# Patient Record
Sex: Female | Born: 1955 | Race: Black or African American | Hispanic: No | Marital: Single | Smoking: Former smoker
Health system: Southern US, Community
[De-identification: ages and names within clinical notes are randomized; demographics above are authoritative.]

## PROBLEM LIST (undated history)

## (undated) DIAGNOSIS — N289 Disorder of kidney and ureter, unspecified: Secondary | ICD-10-CM

## (undated) DIAGNOSIS — K859 Acute pancreatitis without necrosis or infection, unspecified: Secondary | ICD-10-CM

## (undated) DIAGNOSIS — I10 Essential (primary) hypertension: Secondary | ICD-10-CM

## (undated) DIAGNOSIS — L0390 Cellulitis, unspecified: Secondary | ICD-10-CM

## (undated) DIAGNOSIS — K219 Gastro-esophageal reflux disease without esophagitis: Secondary | ICD-10-CM

---

## 2008-06-15 ENCOUNTER — Emergency Department (HOSPITAL_BASED_OUTPATIENT_CLINIC_OR_DEPARTMENT_OTHER): Admission: EM | Admit: 2008-06-15 | Discharge: 2008-06-15 | Payer: Self-pay | Admitting: Emergency Medicine

## 2009-07-07 ENCOUNTER — Emergency Department (HOSPITAL_BASED_OUTPATIENT_CLINIC_OR_DEPARTMENT_OTHER): Admission: EM | Admit: 2009-07-07 | Discharge: 2009-07-07 | Payer: Self-pay | Admitting: Emergency Medicine

## 2009-07-07 ENCOUNTER — Ambulatory Visit: Payer: Self-pay | Admitting: Diagnostic Radiology

## 2013-05-20 DIAGNOSIS — L0390 Cellulitis, unspecified: Secondary | ICD-10-CM

## 2013-05-20 HISTORY — DX: Cellulitis, unspecified: L03.90

## 2013-05-30 ENCOUNTER — Encounter (HOSPITAL_BASED_OUTPATIENT_CLINIC_OR_DEPARTMENT_OTHER): Payer: Self-pay | Admitting: Emergency Medicine

## 2013-05-30 ENCOUNTER — Emergency Department (HOSPITAL_BASED_OUTPATIENT_CLINIC_OR_DEPARTMENT_OTHER)
Admission: EM | Admit: 2013-05-30 | Discharge: 2013-05-30 | Disposition: A | Discharge status: Home / Self Care | Payer: No Typology Code available for payment source | Attending: Emergency Medicine | Admitting: Emergency Medicine

## 2013-05-30 DIAGNOSIS — R51 Headache: Secondary | ICD-10-CM | POA: Insufficient documentation

## 2013-05-30 DIAGNOSIS — Z79899 Other long term (current) drug therapy: Secondary | ICD-10-CM | POA: Insufficient documentation

## 2013-05-30 DIAGNOSIS — L02619 Cutaneous abscess of unspecified foot: Secondary | ICD-10-CM | POA: Insufficient documentation

## 2013-05-30 DIAGNOSIS — L0390 Cellulitis, unspecified: Secondary | ICD-10-CM

## 2013-05-30 DIAGNOSIS — Z880 Allergy status to penicillin: Secondary | ICD-10-CM | POA: Insufficient documentation

## 2013-05-30 DIAGNOSIS — F17200 Nicotine dependence, unspecified, uncomplicated: Secondary | ICD-10-CM | POA: Insufficient documentation

## 2013-05-30 MED ORDER — LEVOFLOXACIN 500 MG PO TABS: 500.0000 mg | ORAL_TABLET | Freq: Every day | ORAL | Status: DC

## 2013-05-30 MED ORDER — LORATADINE 10 MG PO TABS: 10.0000 mg | ORAL_TABLET | Freq: Every day | ORAL | Status: DC

## 2013-05-30 MED ORDER — HYDROCODONE-ACETAMINOPHEN 5-325 MG PO TABS: 1.0000 | ORAL_TABLET | ORAL | Status: DC | PRN

## 2013-05-30 MED ORDER — LEVOFLOXACIN IN D5W 500 MG/100ML IV SOLN
500.0000 mg | Freq: Once | INTRAVENOUS | Status: AC
  Administered 2013-05-30: 500 mg via INTRAVENOUS
  Filled 2013-05-30: qty 100

## 2013-05-30 NOTE — ED Provider Notes (Signed)
CSN: [id number]     Arrival date & time 05/30/13  1632 History   First MD Initiated Contact with Patient 05/30/13 1702     Chief Complaint  Patient presents with  . Insect Bite    HPI Patient states she thinks she was stung by any insect or bitten by some insect about 4 days ago. She was putting her foot into his shoe when she felt a sharp pain in the top of her foot. She struck her foot out but did not see any particular bug or insect. She subsequently developed an area of redness and irritation that was initially itchy. She subsequently developed a rash on her torso as well as her face. This rash was itchy as well and she noticed some raised small bumps.  Over the last day or 2 she started developing pain in her right foot as well as redness and swelling that extended up to the top of her life. She has not noticed any fevers. She has not had any vomiting or diarrhea. She has no swelling or irritation any of her joints. She does have a slight headache. History reviewed. No pertinent past medical history. History reviewed. No pertinent past surgical history. No family history on file. History  Substance Use Topics  . Smoking status: Current Every Day Smoker -- 0.50 packs/day    Types: Cigarettes  . Smokeless tobacco: Not on file  . Alcohol Use: No   OB History   Grav Para Term Preterm Abortions TAB SAB Ect Mult Living                 Review of Systems  All other systems reviewed and are negative.    Allergies  Penicillins  Home Medications   Current Outpatient Rx  Name  Route  Sig  Dispense  Refill  . HYDROcodone-acetaminophen (NORCO/VICODIN) 5-325 MG per tablet   Oral   Take 1-2 tablets by mouth every 4 (four) hours as needed.   16 tablet   0   . levofloxacin (LEVAQUIN) 500 MG tablet   Oral   Take 1 tablet (500 mg total) by mouth daily.   10 tablet   0   . loratadine (CLARITIN) 10 MG tablet   Oral   Take 1 tablet (10 mg total) by mouth daily.   30 tablet   0     BP 106/87  Pulse 79  Temp(Src) 98.9 F (37.2 C) (Oral)  Resp 18  Ht 5\' 4"  (1.626 m)  Wt 198 lb 9 oz (90.067 kg)  BMI 34.07 kg/m2  SpO2 97% Physical Exam  Nursing note and vitals reviewed. Constitutional: She appears well-developed and well-nourished. No distress.  HENT:  Head: Normocephalic and atraumatic.  Right Ear: External ear normal.  Left Ear: External ear normal.  Eyes: Conjunctivae are normal. Right eye exhibits no discharge. Left eye exhibits no discharge. No scleral icterus.  Neck: Neck supple. No tracheal deviation present.  Cardiovascular: Normal rate, regular rhythm and intact distal pulses.   Pulmonary/Chest: Effort normal and breath sounds normal. No stridor. No respiratory distress. She has no wheezes. She has no rales.  Abdominal: Soft. Bowel sounds are normal. She exhibits no distension. There is no tenderness. There is no rebound and no guarding.  Musculoskeletal: She exhibits no edema and no tenderness.  Dorsal aspect of her foot there is an area of excoriated ulcerated skin with erythema that extends up towards her shin and calf, this area is warm to the touch and tender  Neurological: She is alert. She has normal strength. No sensory deficit. Cranial nerve deficit:  no gross defecits noted. She exhibits normal muscle tone. She displays no seizure activity. Coordination normal.  Skin: Skin is warm and dry. Rash noted.  Papular, urticarial type rash on her extremities torso and also involves her face, lesions are small approximately 2-3 mm, no areas of confluence  Psychiatric: She has a normal mood and affect.   image of her foot condition is attached to this note  ED Course  Procedures (including critical care time) Labs Review Labs Reviewed - No data to display Imaging Review No results found.  EKG Interpretation   None       MDM   1. Cellulitis     Suspect the patient initially developed some sort of allergic type reaction to some type of bug  sting or bite. She appears to subsequently have developed a subsequent cellulitis of her foot and leg. I will discharge her home on antibiotics and antihistamines. I recommend close followup for recheck in 48 hours      Celene Kras, MD 05/30/13 [phone]

## 2013-05-30 NOTE — ED Notes (Signed)
Possible insect bite to her right foot 4 days ago. Now she has a rash over her body. Swelling to her right foot and leg. Drainage from the site of the bite. Headache.

## 2013-06-01 ENCOUNTER — Encounter (HOSPITAL_BASED_OUTPATIENT_CLINIC_OR_DEPARTMENT_OTHER): Payer: Self-pay | Admitting: Emergency Medicine

## 2013-06-01 ENCOUNTER — Emergency Department (HOSPITAL_BASED_OUTPATIENT_CLINIC_OR_DEPARTMENT_OTHER): Payer: No Typology Code available for payment source

## 2013-06-01 ENCOUNTER — Emergency Department (HOSPITAL_BASED_OUTPATIENT_CLINIC_OR_DEPARTMENT_OTHER)
Admission: EM | Admit: 2013-06-01 | Discharge: 2013-06-01 | Disposition: A | Discharge status: Home / Self Care | Payer: No Typology Code available for payment source | Attending: Emergency Medicine | Admitting: Emergency Medicine

## 2013-06-01 DIAGNOSIS — M79609 Pain in unspecified limb: Secondary | ICD-10-CM | POA: Insufficient documentation

## 2013-06-01 DIAGNOSIS — Z79899 Other long term (current) drug therapy: Secondary | ICD-10-CM | POA: Insufficient documentation

## 2013-06-01 DIAGNOSIS — R220 Localized swelling, mass and lump, head: Secondary | ICD-10-CM | POA: Insufficient documentation

## 2013-06-01 DIAGNOSIS — L03115 Cellulitis of right lower limb: Secondary | ICD-10-CM

## 2013-06-01 DIAGNOSIS — Z880 Allergy status to penicillin: Secondary | ICD-10-CM | POA: Insufficient documentation

## 2013-06-01 DIAGNOSIS — F17200 Nicotine dependence, unspecified, uncomplicated: Secondary | ICD-10-CM | POA: Insufficient documentation

## 2013-06-01 DIAGNOSIS — L02419 Cutaneous abscess of limb, unspecified: Secondary | ICD-10-CM | POA: Insufficient documentation

## 2013-06-01 DIAGNOSIS — R21 Rash and other nonspecific skin eruption: Secondary | ICD-10-CM | POA: Insufficient documentation

## 2013-06-01 LAB — CBC WITH DIFFERENTIAL/PLATELET
Basophils Absolute: 0 10*3/uL (ref 0.0–0.1)
Eosinophils Absolute: 0.5 10*3/uL (ref 0.0–0.7)
Eosinophils Relative: 8 % — ABNORMAL HIGH (ref 0–5)
Lymphs Abs: 1.7 10*3/uL (ref 0.7–4.0)
MCV: 96.5 fL (ref 78.0–100.0)
Monocytes Absolute: 0.7 10*3/uL (ref 0.1–1.0)
Platelets: 123 10*3/uL — ABNORMAL LOW (ref 150–400)
RBC: 3.97 MIL/uL (ref 3.87–5.11)
RDW: 14.3 % (ref 11.5–15.5)
WBC: 6 10*3/uL (ref 4.0–10.5)

## 2013-06-01 LAB — COMPREHENSIVE METABOLIC PANEL
ALT: 7 U/L (ref 0–35)
Alkaline Phosphatase: 58 U/L (ref 39–117)
Potassium: 3.5 mEq/L (ref 3.5–5.1)
Total Protein: 7.2 g/dL (ref 6.0–8.3)

## 2013-06-01 MED ORDER — PREDNISONE 10 MG PO TABS: 20.0000 mg | ORAL_TABLET | Freq: Two times a day (BID) | ORAL | Status: DC

## 2013-06-01 MED ORDER — CEFTRIAXONE SODIUM 1 G IJ SOLR
INTRAMUSCULAR | Status: AC
  Filled 2013-06-01: qty 10

## 2013-06-01 MED ORDER — DEXTROSE 5 % IV SOLN
1.0000 g | Freq: Once | INTRAVENOUS | Status: AC
  Administered 2013-06-01: 1 g via INTRAVENOUS

## 2013-06-01 MED ORDER — METHYLPREDNISOLONE SODIUM SUCC 125 MG IJ SOLR
125.0000 mg | Freq: Once | INTRAMUSCULAR | Status: AC
  Administered 2013-06-01: 125 mg via INTRAVENOUS
  Filled 2013-06-01: qty 2

## 2013-06-01 MED ORDER — CEPHALEXIN 500 MG PO CAPS: 500.0000 mg | ORAL_CAPSULE | Freq: Four times a day (QID) | ORAL | Status: DC

## 2013-06-01 NOTE — ED Notes (Signed)
Patient asked to change into gown. 

## 2013-06-01 NOTE — Progress Notes (Signed)
ED CM received pharmacy call from [facility] regarding, Verification of discharge antibiotic. Levaquin 500mg sPO changed to Keflex 500mg  4 x daily. No further CM needs identified.

## 2013-06-01 NOTE — ED Provider Notes (Signed)
CSN: [id number]     Arrival date & time 06/01/13  1138 History   First MD Initiated Contact with Patient 06/01/13 1154     Chief Complaint  Patient presents with  . Facial Swelling  . Rash  . Wound Check  . Foot Pain   (Consider location/radiation/quality/duration/timing/severity/associated sxs/prior Treatment) HPI Comments: Patient is a 57 year old female with no significant past medical history who presents for followup of lower extremity cellulitis secondary to a suspected insect bite. She was seen here 2 days ago and given Levaquin IV. She returns today for a wound check. She states that the redness and discomfort has not improved. She denies any fevers or chills. She denies any chest pain or shortness of breath. It is more uncomfortable when she bears weight and better with rest.  The history is provided by the patient.    History reviewed. No pertinent past medical history. History reviewed. No pertinent past surgical history. No family history on file. History  Substance Use Topics  . Smoking status: Current Every Day Smoker -- 0.50 packs/day    Types: Cigarettes  . Smokeless tobacco: Not on file  . Alcohol Use: No   OB History   Grav Para Term Preterm Abortions TAB SAB Ect Mult Living                 Review of Systems  All other systems reviewed and are negative.    Allergies  Penicillins  Home Medications   Current Outpatient Rx  Name  Route  Sig  Dispense  Refill  . HYDROcodone-acetaminophen (NORCO/VICODIN) 5-325 MG per tablet   Oral   Take 1-2 tablets by mouth every 4 (four) hours as needed.   16 tablet   0   . levofloxacin (LEVAQUIN) 500 MG tablet   Oral   Take 1 tablet (500 mg total) by mouth daily.   10 tablet   0   . loratadine (CLARITIN) 10 MG tablet   Oral   Take 1 tablet (10 mg total) by mouth daily.   30 tablet   0    BP 124/83  Pulse 112  Temp(Src) 99.2 F (37.3 C) (Oral)  Resp 18  Ht 5\' 4"  (1.626 m)  SpO2 100% Physical Exam   Nursing note and vitals reviewed. Constitutional: She is oriented to person, place, and time. She appears well-developed and well-nourished. No distress.  HENT:  Head: Normocephalic and atraumatic.  Mouth/Throat: Oropharynx is clear and moist.  Neck: Normal range of motion. Neck supple.  Cardiovascular: Normal rate and regular rhythm.  Exam reveals no gallop and no friction rub.   No murmur heard. Pulmonary/Chest: Effort normal and breath sounds normal. No respiratory distress. She has no wheezes.  There is no stridor.  Abdominal: Soft. Bowel sounds are normal. She exhibits no distension. There is no tenderness.  Musculoskeletal: Normal range of motion.  The right foot and ankle are noted to be swollen and erythematous. They're warm to the touch area there is no calf pain and dorsalis pedis and posterior tibial pulses are easily palpable.  Neurological: She is alert and oriented to person, place, and time.  Skin: Skin is warm and dry. She is not diaphoretic.  There is an urticarial rash present to the back of the neck face arms and legs.    ED Course  Procedures (including critical care time) Labs Review Labs Reviewed  CBC WITH DIFFERENTIAL - Abnormal; Notable for the following:    Platelets 123 (*)    Eosinophils  Relative 8 (*)    All other components within normal limits  COMPREHENSIVE METABOLIC PANEL - Abnormal; Notable for the following:    Creatinine, Ser 1.20 (*)    GFR calc non Af Amer 50 (*)    GFR calc Af Amer 57 (*)    All other components within normal limits   Imaging Review US Venous Img Lower Unilateral Right  06/01/2013   CLINICAL DATA:  RIGHT LOWER EXTREMITY ERYTHEMA, SWELLING AND PAIN, RECENT HISTORY OF SPIDER BITES, HISTORY IS SMOKING, EVALUATE FOR DVT  EXAM: RIGHT LOWER EXTREMITY VENOUS ULTRASOUND  TECHNIQUE: Gray-scale sonography with graded compression, as well as color Doppler and duplex ultrasound, were performed to evaluate the deep venous system from the  level of the common femoral vein through the popliteal and proximal calf veins. Spectral Doppler was utilized to evaluate flow at rest and with distal augmentation maneuvers.  COMPARISON:  None.  FINDINGS: Thrombus within deep veins:  None visualized.  Compressibility of deep veins:  Normal.  Duplex waveform respiratory phasicity:  Normal.  Duplex waveform response to augmentation:  Normal.  Venous reflux:  None visualized.  Other findings: There is a minimal amount of subcutaneous edema at the level of the ankle (images 18 and 23).  There are two adjacent enlarged lymph node within the medial aspect of the right upper thigh which measures approximately 1.3 cm in greatest short axis diameter (image 30) as well as an adjacent lymph node which measures approximate 1.3 cm (image 32).  IMPRESSION: 1. No evidence of DVT within the right lower extremity. 2. Adenopathy within the medial aspect of the right upper thigh, possibly reactive given history of recent spider bites, however malignancy is not excluded on the basis of this examination. Repeat inguinal soft tissue ultrasound may be performed after the resolution of acute symptoms as clinically indicated. This was made a call report.   Electronically Signed   By: Simonne Come M.D.   On: 06/01/2013 12:48    EKG Interpretation   None       MDM  No diagnosis found. There does not seem to be much improvement in the fast 2 days in the degree of cellulitis. She is also having a rash that I suspect may be a reaction to the Levaquin. For this reason I will change the antibiotic to Rocephin and discharge her to home with Keflex. She will also be given steroids for appears to be an allergic reaction. I discussed the possibility of admission with her, however she prefers to give a new antibiotic a chance and see how she responds. She understands that if she worsens or develops high fever, vomiting, or other complaints she is return to the ER to be  reevaluated.    Geoffery Lyons, MD 06/01/13 [phone]

## 2013-06-01 NOTE — ED Notes (Signed)
Pt was seen in ED Tuesday for right foot/wound after sustaining a bee sting on top of foot 1 week prior.  Pt has facial/neck swelling and generalized rash.

## 2013-06-18 ENCOUNTER — Emergency Department (HOSPITAL_COMMUNITY): Payer: No Typology Code available for payment source

## 2013-06-18 ENCOUNTER — Inpatient Hospital Stay (HOSPITAL_COMMUNITY)
Admission: EM | Admit: 2013-06-18 | Discharge: 2013-06-21 | DRG: 607 | Disposition: A | Discharge status: Home / Self Care | Payer: Self-pay | Attending: Internal Medicine | Admitting: Internal Medicine

## 2013-06-18 ENCOUNTER — Encounter (HOSPITAL_COMMUNITY): Payer: Self-pay | Admitting: Emergency Medicine

## 2013-06-18 DIAGNOSIS — L03115 Cellulitis of right lower limb: Secondary | ICD-10-CM | POA: Diagnosis present

## 2013-06-18 DIAGNOSIS — L259 Unspecified contact dermatitis, unspecified cause: Principal | ICD-10-CM | POA: Diagnosis present

## 2013-06-18 DIAGNOSIS — L539 Erythematous condition, unspecified: Secondary | ICD-10-CM

## 2013-06-18 DIAGNOSIS — M7989 Other specified soft tissue disorders: Secondary | ICD-10-CM | POA: Diagnosis present

## 2013-06-18 DIAGNOSIS — R21 Rash and other nonspecific skin eruption: Secondary | ICD-10-CM | POA: Diagnosis present

## 2013-06-18 DIAGNOSIS — F17200 Nicotine dependence, unspecified, uncomplicated: Secondary | ICD-10-CM | POA: Diagnosis present

## 2013-06-18 DIAGNOSIS — R220 Localized swelling, mass and lump, head: Secondary | ICD-10-CM | POA: Diagnosis present

## 2013-06-18 HISTORY — DX: Cellulitis, unspecified: L03.90

## 2013-06-18 HISTORY — DX: Gastro-esophageal reflux disease without esophagitis: K21.9

## 2013-06-18 HISTORY — DX: Essential (primary) hypertension: I10

## 2013-06-18 LAB — CBC WITH DIFFERENTIAL/PLATELET
Basophils Absolute: 0.1 10*3/uL (ref 0.0–0.1)
Lymphocytes Relative: 23 % (ref 12–46)
MCH: 33.8 pg (ref 26.0–34.0)
MCV: 96.8 fL (ref 78.0–100.0)
Monocytes Absolute: 0.7 10*3/uL (ref 0.1–1.0)
Monocytes Relative: 8 % (ref 3–12)
Neutro Abs: 4 10*3/uL (ref 1.7–7.7)
Neutrophils Relative %: 49 % (ref 43–77)
Platelets: 205 10*3/uL (ref 150–400)
WBC: 8.1 10*3/uL (ref 4.0–10.5)

## 2013-06-18 LAB — COMPREHENSIVE METABOLIC PANEL
ALT: 23 U/L (ref 0–35)
AST: 17 U/L (ref 0–37)
Albumin: 3.2 g/dL — ABNORMAL LOW (ref 3.5–5.2)
CO2: 25 mEq/L (ref 19–32)
Calcium: 8.9 mg/dL (ref 8.4–10.5)
Glucose, Bld: 82 mg/dL (ref 70–99)
Total Bilirubin: 0.4 mg/dL (ref 0.3–1.2)
Total Protein: 6.3 g/dL (ref 6.0–8.3)

## 2013-06-18 MED ORDER — ENOXAPARIN SODIUM 40 MG/0.4ML ~~LOC~~ SOLN
40.0000 mg | SUBCUTANEOUS | Status: DC
  Administered 2013-06-18 – 2013-06-20 (×3): 40 mg via SUBCUTANEOUS
  Filled 2013-06-18 (×5): qty 0.4

## 2013-06-18 MED ORDER — HYDROCODONE-ACETAMINOPHEN 5-325 MG PO TABS
1.0000 | ORAL_TABLET | Freq: Four times a day (QID) | ORAL | Status: DC | PRN
  Administered 2013-06-20: 1 via ORAL
  Filled 2013-06-18: qty 1

## 2013-06-18 MED ORDER — DEXAMETHASONE SODIUM PHOSPHATE 10 MG/ML IJ SOLN
10.0000 mg | Freq: Once | INTRAMUSCULAR | Status: AC
  Administered 2013-06-18: 10 mg via INTRAVENOUS
  Filled 2013-06-18: qty 1

## 2013-06-18 MED ORDER — VANCOMYCIN HCL 10 G IV SOLR
1.0000 g | Freq: Once | INTRAVENOUS | Status: DC
  Filled 2013-06-18 (×2): qty 1000

## 2013-06-18 MED ORDER — DIPHENHYDRAMINE HCL 25 MG PO CAPS
25.0000 mg | ORAL_CAPSULE | Freq: Four times a day (QID) | ORAL | Status: DC | PRN
  Administered 2013-06-18 – 2013-06-21 (×6): 25 mg via ORAL
  Filled 2013-06-18 (×6): qty 1

## 2013-06-18 MED ORDER — HYDROXYZINE HCL 25 MG PO TABS: 25.0000 mg | ORAL_TABLET | Freq: Four times a day (QID) | ORAL | Status: DC | PRN

## 2013-06-18 MED ORDER — VANCOMYCIN HCL IN DEXTROSE 1-5 GM/200ML-% IV SOLN
1000.0000 mg | Freq: Once | INTRAVENOUS | Status: AC
  Administered 2013-06-18: 1000 mg via INTRAVENOUS
  Filled 2013-06-18: qty 200

## 2013-06-18 MED ORDER — NICOTINE 7 MG/24HR TD PT24
7.0000 mg | MEDICATED_PATCH | Freq: Every day | TRANSDERMAL | Status: DC
  Administered 2013-06-19 (×2): 7 mg via TRANSDERMAL
  Filled 2013-06-18 (×5): qty 1

## 2013-06-18 NOTE — ED Notes (Signed)
Admitting MD at bedside.

## 2013-06-18 NOTE — ED Provider Notes (Signed)
  This was a shared visit with a mid-level provided (NP or PA).  Throughout the patient's course I was available for consultation/collaboration.  I saw the ECG (if appropriate), relevant labs and studies - I agree with the interpretation.  On my exam the patient was in no distress.  However her right leg was grossly infected.  With no improvement following multiple prior oral antibiotics she is admitted for further evaluation and management the      Gerhard Munch, MD 06/18/13 1958

## 2013-06-18 NOTE — ED Notes (Signed)
Called lunch tray for patient

## 2013-06-18 NOTE — Progress Notes (Signed)
Right lower extremity venous duplex completed.  Right:  No evidence of DVT, superficial thrombosis, or Baker's cyst.  Left:  Negative for DVT in the common femoral vein.  

## 2013-06-18 NOTE — Progress Notes (Signed)
Spoke with MD about patients aversion to using Atarax to control itching d/t ineffectiveness. MD to order Benedryl to control itching.

## 2013-06-18 NOTE — H&P (Signed)
Date: 06/18/2013               Patient Name:  Kelly Callahan MRN: [id number]  DOB: Oct 15, 1955 Age / Sex: 57 y.o., female   PCP: Pcp Not In System         Medical Service: Internal Medicine Teaching Service         Attending Physician: Dr. Rogelia Boga    First Contact: Dr. Mariea Clonts Pager: [phone]  Second Contact: Dr. Zada Girt Pager: [phone]       After Hours (After 5p/  First Contact Pager: [phone]  weekends / holidays): Second Contact Pager: [phone]   Chief Complaint: right leg swelling  History of Present Illness:  Kelly Callahan is a 57 year old woman with no significant past medical history who presents with worsening redness and swelling of RLE x 3 weeks.   Patient states she was bitten by an insect on dorsal right foot approximately one month ago.  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  She went to slip her foot in the sandal when she felt something sting or bite her.  She did not see the insect but thinks it was a spider "because that's the only thing that hides in your shoe and bites you."  The week of the bite, she did not have much swelling or redness, but after a week or so had passed, she started experiencing redness and swelling of her foot.  She sought medical attention at [facility] where she was started on Levaquin and prednisone for presumed cellulitis.  However, she developed hives soon after so was switched to Keflex.  She has reportedly been taking Keflex with good compliance since 11/13 and completed 5 day course of prednisone; she has also applied hydrocortisone cream to area with no improvement.  Patient does report that the dorsal surface of her right foot has been oozing clear drainage but denies fever or systemic symptoms.  She "doesn't notice" her foot if the leg is elevated but painful with walking.  She has had some neck, ear, and periorbital swelling as well which she states are improving.  Denies lip, tongue, throat swelling or  difficulty breathing.  No recent new skin products; does not spend time outdoors and does not have pets.  She does not have seasonal or food allergies though she does have medication allergy to penicillin; she may have had eczema years ago.  Patient would not have come to the ED today if her brother had not forced her to.    She smokes 1/2 PPD (requesting patch), no EtOH or illicits.  She lives with her daughter who has not had any similar symptoms.     Meds: Current Facility-Administered Medications  Medication Dose Route Frequency Provider Last Rate Last Dose  . vancomycin (VANCOCIN) IVPB 1000 mg/200 mL premix  1,000 mg Intravenous Once Gerhard Munch, MD       Current Outpatient Prescriptions  Medication Sig Dispense Refill  . cephALEXin (KEFLEX) 500 MG capsule Take 1 capsule (500 mg total) by mouth 4 (four) times daily.  40 capsule  0  . HYDROcodone-acetaminophen (NORCO/VICODIN) 5-325 MG per tablet Take 1 tablet by mouth every 4 (four) hours as needed for moderate pain.      . hydrOXYzine (ATARAX/VISTARIL) 25 MG tablet Take 25 mg by mouth 4 (four) times daily as needed for itching.      Marland Kitchen ibuprofen (ADVIL,MOTRIN) 200 MG tablet Take 400 mg by mouth every 6 (six)  hours as needed for moderate pain.      . predniSONE (DELTASONE) 10 MG tablet Take 2 tablets (20 mg total) by mouth 2 (two) times daily.  20 tablet  0    Allergies: Allergies as of 06/18/2013 - Review Complete 06/18/2013  Allergen Reaction Noted  . Penicillins  05/30/2013   History reviewed. No pertinent past medical history. History reviewed. No pertinent past surgical history. History reviewed. No pertinent family history. History   Social History  . Marital Status: Single    Spouse Name: N/A    Number of Children: N/A  . Years of Education: N/A   Occupational History  . Not on file.   Social History Main Topics  . Smoking status: Current Every Day Smoker -- 0.50 packs/day    Types: Cigarettes  . Smokeless  tobacco: Not on file  . Alcohol Use: No  . Drug Use: No  . Sexual Activity: Not on file   Other Topics Concern  . Not on file   Social History Narrative  . No narrative on file    Review of Systems: Review of Systems  Constitutional: Negative for fever and chills.  HENT: Negative for sore throat.   Eyes: Negative for blurred vision.  Respiratory: Negative for cough, shortness of breath and wheezing.   Cardiovascular: Positive for leg swelling. Negative for chest pain and palpitations.  Gastrointestinal: Negative for nausea, vomiting, abdominal pain, diarrhea, constipation and blood in stool.  Genitourinary: Negative for dysuria.  Musculoskeletal: Negative for falls and joint pain.  Skin: Positive for itching and rash.  Neurological: Positive for tingling. Negative for dizziness, loss of consciousness, weakness and headaches.    Physical Exam: Blood pressure 126/70, pulse 86, temperature 98.3 F (36.8 C), temperature source Oral, resp. rate 16, weight 170 lb 4.8 oz (77.248 kg), SpO2 100.00%. General: alert, cooperative, and in no apparent distress HEENT: NCAT, vision grossly intact, oropharynx clear and non-erythematous; atopic appearance of periocular area and neck; no conjunctival hyperemia or chemosis, no decreased ocular motility, no pain with eye movements, no decreased visual acuity Neck: supple, no lymphadenopathy Lungs: clear to ascultation bilaterally, normal work of respiration, no wheezes, rales, ronchi Heart: regular rate and rhythm, no murmurs, gallops, or rubs Abdomen: soft, non-tender, non-distended, normal bowel sounds Extremities: right lower extremity erythematous and edematous up to ~2 inches inferior to knee with marked edema of lateral ankle, TTP of dorsal foot, skin flaky with some fissuring; right ankle ROM intact; 2+ DP/PT pulses bilaterally, no cyanosis, clubbing Neurologic: alert & oriented X3, cranial nerves II-XII intact, strength grossly intact,  sensation intact to light touch   Lab results: Basic Metabolic Panel:  Recent Labs  16/10/96 1334  NA 143  K 3.9  CL 109  CO2 25  GLUCOSE 82  BUN 19  CREATININE 1.09  CALCIUM 8.9   Liver Function Tests:  Recent Labs  06/18/13 1334  AST 17  ALT 23  ALKPHOS 58  BILITOT 0.4  PROT 6.3  ALBUMIN 3.2*   CBC:  Recent Labs  06/18/13 1334  WBC 8.1  NEUTROABS 4.0  HGB 12.8  HCT 36.7  MCV 96.8  PLT 205    Imaging results:  Dg Ankle Complete Right  06/18/2013   CLINICAL DATA:  Leg swelling  EXAM: RIGHT ANKLE - COMPLETE 3+ VIEW  COMPARISON:  None.  FINDINGS: There is prominent subcutaneous soft tissue swelling at the level of the distal half of the tibia and fibula and marked diffuse swelling about the ankle. The  bones appear osteopenic. No fracture or focal bony abnormality.  IMPRESSION: Diffuse subcutaneous edema swelling of the visualized lower extremity. No acute bony abnormality.   Electronically Signed   By: Britta Mccreedy M.D.   On: 06/18/2013 13:16   Dg Foot Complete Right  06/18/2013   CLINICAL DATA:  Leg swelling.  Stone back in 6 2 weeks ago.  EXAM: RIGHT FOOT COMPLETE - 3+ VIEW  COMPARISON:  Ankle radiographs 06/18/2013  FINDINGS: The bones are diffusely osteopenic. The joints are located. No acute or healing fracture is identified. There is diffuse soft tissue swelling, particularly along the dorsum of the foot.  IMPRESSION: Diffuse soft tissue swelling.  No acute bony abnormality identified.   Electronically Signed   By: Britta Mccreedy M.D.   On: 06/18/2013 13:17    Assessment & Plan by Problem: #RLE erythema and swelling- Patient presents with these symptoms x 3 weeks following insect bite of dorsal foot approximately one month ago.  Unclear etiology but differential includes cellulitis vs. contact dermatitis vs. DVT vs. evenomation following spider bite.  Right foot and ankle xrays showed diffuse soft tissue swelling but no acute bony abnormality. RLE Doppler  showed no evidence of DVT, superficial thrombosis, or Baker's cyst.  ED provider felt that findings represent cellulitis but somewhat less likely given lack of drainage, patient afebrile and without leucocytosis; she meets 0/4 SIRS criteria.  She received one dose of vancomycin, Decadron 10 mg IV in ED.   Could consider skin biopsy if etiology remains unclear.  Will admit and continue antibiotics for now as she "failed" outpatient management on PO Keflex and prednisone x 5 days.  -admit to IMTS -continue vancomycin for now -Norco 5-235 mg q6h prn pain -hydroxyzine 25 mg QID prn itching -BMP in AM  #Possible drug reaction- Patient has patchy erythematous skin on face and neck which she states began after taking Levoquin, now improved per patient.  This may represent a drug reaction though time course less convincing.  Added Levoquin to her allergy list.  No concern for orbital cellulitis given no conjunctival hyperemia or chemosis, no decreased ocular motility, no pain with eye movements, no decreased visual acuity.  -Benadryl 25 mg PO q8h itching -continue to monitor  #Tobacco abuse- Patient smokes 1/2 PPD.  -smoking cessation counseling -nicotine patch  #DVT PPX- lovenox  #Code status- Full code  Dispo: Disposition is deferred at this time, awaiting improvement of current medical problems. Anticipated discharge in approximately 1-2 day(s).   The patient does not have a current PCP (Pcp Not In System) and does need an [facility] hospital follow-up appointment after discharge.   Signed: Rocco Serene, MD 06/18/2013, 3:37 PM

## 2013-06-18 NOTE — ED Notes (Signed)
MD at bedside. 

## 2013-06-18 NOTE — ED Notes (Signed)
Pt reports she was bitten by an insect on top of R foot several weeks ago. Was seen at [facility] and started on abx for infection of the bite. States since initial injury despite antibiotics she is having increased swelling of the RLE with weepy drainage and redness. States now her R side of face is swollen.

## 2013-06-18 NOTE — ED Notes (Signed)
Patient transported to X-ray 

## 2013-06-18 NOTE — ED Provider Notes (Signed)
CSN: [id number]     Arrival date & time 06/18/13  1206 History   First MD Initiated Contact with Patient 06/18/13 1221     Chief Complaint  Patient presents with  . Facial Swelling  . Leg Swelling   (Consider location/radiation/quality/duration/timing/severity/associated sxs/prior Treatment) HPI Kelly Callahan is a 57 y.o. female who presents to emergency department complaining of rash and swelling to the right leg. Patient states that she thinks that something bit her on the right foot approximately 2 weeks ago. She was seen at [facility] and was started on Levaquin and prednisone. States that she went back to days later because shortly after taking Levaquin she developed a rash all over her body. States rash is itchy. At that time she had blood work done which was normal and was switched to Keflex. Patient states for the last 2 weeks she's been taking Keflex but her leg continued to swell. States swelling is not up to her calf. States rash is not improving either. She is taking Claritin, hydroxyzine, Keflex and she finished a 5 day course of prednisone. Pt states she is having pain in the right leg. States it is tender and pain with walking. Pt unsure of any injuries. States no fever, chills at home. States she is a [employer].   History reviewed. No pertinent past medical history. History reviewed. No pertinent past surgical history. History reviewed. No pertinent family history. History  Substance Use Topics  . Smoking status: Current Every Day Smoker -- 0.50 packs/day    Types: Cigarettes  . Smokeless tobacco: Not on file  . Alcohol Use: No   OB History   Grav Para Term Preterm Abortions TAB SAB Ect Mult Living                 Review of Systems  Constitutional: Negative for fever and chills.  Respiratory: Negative for cough, chest tightness and shortness of breath.   Cardiovascular: Negative for chest pain, palpitations and leg swelling.  Gastrointestinal: Negative  for nausea, vomiting, abdominal pain and diarrhea.  Genitourinary: Negative for dysuria, flank pain, vaginal bleeding, vaginal discharge, vaginal pain and pelvic pain.  Musculoskeletal: Positive for arthralgias and joint swelling. Negative for myalgias, neck pain and neck stiffness.  Skin: Positive for rash.  Neurological: Negative for dizziness, weakness, numbness and headaches.  All other systems reviewed and are negative.    Allergies  Penicillins  Home Medications   Current Outpatient Rx  Name  Route  Sig  Dispense  Refill  . cephALEXin (KEFLEX) 500 MG capsule   Oral   Take 1 capsule (500 mg total) by mouth 4 (four) times daily.   40 capsule   0   . HYDROcodone-acetaminophen (NORCO/VICODIN) 5-325 MG per tablet   Oral   Take 1-2 tablets by mouth every 4 (four) hours as needed.   16 tablet   0   . levofloxacin (LEVAQUIN) 500 MG tablet   Oral   Take 1 tablet (500 mg total) by mouth daily.   10 tablet   0   . loratadine (CLARITIN) 10 MG tablet   Oral   Take 1 tablet (10 mg total) by mouth daily.   30 tablet   0   . predniSONE (DELTASONE) 10 MG tablet   Oral   Take 2 tablets (20 mg total) by mouth 2 (two) times daily.   20 tablet   0    BP 135/84  Pulse 102  Temp(Src) 98.3 F (36.8 C) (Oral)  Resp 16  Wt 170 lb 4.8 oz (77.248 kg)  SpO2 100% Physical Exam  Nursing note and vitals reviewed. Constitutional: She is oriented to person, place, and time. She appears well-developed and well-nourished. No distress.  HENT:  Head: Normocephalic.  Eyes: Conjunctivae are normal.  Neck: Neck supple.  Cardiovascular: Normal rate, regular rhythm and normal heart sounds.   Pulmonary/Chest: Effort normal and breath sounds normal. No respiratory distress. She has no wheezes. She has no rales.  Abdominal: Soft. Bowel sounds are normal. She exhibits no distension. There is no tenderness. There is no rebound.  Musculoskeletal: She exhibits no edema.  Swelling noted to the  right foot and right ankle. There is hyperpigmentation and erythema to the foot, ankle, shin. It is warm to the touch and tender to palpation. Dorsal pedal pulses are normal. Cap refill distally is less than 2 seconds.  Neurological: She is alert and oriented to person, place, and time.  Skin: Skin is warm and dry.  Scaly erythematous macular rash to the right foot, bilateral hands, antecubital fossa is of bilateral arms, neck, upper back, lower back, periorbital area.  Psychiatric: She has a normal mood and affect. Her behavior is normal.    ED Course  Procedures (including critical care time) Labs Review Labs Reviewed  CBC WITH DIFFERENTIAL - Abnormal; Notable for the following:    RBC 3.79 (*)    Eosinophils Relative 19 (*)    Eosinophils Absolute 1.5 (*)    All other components within normal limits  COMPREHENSIVE METABOLIC PANEL - Abnormal; Notable for the following:    Albumin 3.2 (*)    GFR calc non Af Amer 56 (*)    GFR calc Af Amer 65 (*)    All other components within normal limits   Imaging Review Dg Ankle Complete Right  06/18/2013   CLINICAL DATA:  Leg swelling  EXAM: RIGHT ANKLE - COMPLETE 3+ VIEW  COMPARISON:  None.  FINDINGS: There is prominent subcutaneous soft tissue swelling at the level of the distal half of the tibia and fibula and marked diffuse swelling about the ankle. The bones appear osteopenic. No fracture or focal bony abnormality.  IMPRESSION: Diffuse subcutaneous edema swelling of the visualized lower extremity. No acute bony abnormality.   Electronically Signed   By: Britta Mccreedy M.D.   On: 06/18/2013 13:16   Dg Foot Complete Right  06/18/2013   CLINICAL DATA:  Leg swelling.  Stone back in 6 2 weeks ago.  EXAM: RIGHT FOOT COMPLETE - 3+ VIEW  COMPARISON:  Ankle radiographs 06/18/2013  FINDINGS: The bones are diffusely osteopenic. The joints are located. No acute or healing fracture is identified. There is diffuse soft tissue swelling, particularly along the  dorsum of the foot.  IMPRESSION: Diffuse soft tissue swelling.  No acute bony abnormality identified.   Electronically Signed   By: Britta Mccreedy M.D.   On: 06/18/2013 13:17    EKG Interpretation   None       MDM   1. Cellulitis of right foot   2. Rash     Patient's with persistent right lower leg pain and swelling. She has finished a course of Keflex. Her swelling and pain is not improving. Only exam she has an edema and pain to the right lower leg. Venous Doppler obtained and is negative. X-rays show soft tissue edema. Labs are unremarkable. I do suspect based on the exam that this is cellulitis or vasculitis of the right lower leg. Given failed outpatient  Rx of worsening swelling will start on IV vancomycin and recommended admission for IV antibiotics for several days. Discussed with Dr. Jeraldine Loots who agreed. Spoke with outpatient clinic admitting team will admit. Patient also has a diffuse rash over her body, looks like a benign, treated with steroids in emergency department. States rash is itchy, no tenderness. No vesicles or lesions.  Filed Vitals:   06/18/13 1208 06/18/13 1445  BP: 135/84 126/70  Pulse: 102 86  Temp: 98.3 F (36.8 C)   TempSrc: Oral   Resp: 16   Weight: 170 lb 4.8 oz (77.248 kg)   SpO2: 100% 100%       Lottie Mussel, PA-C 06/18/13 1556

## 2013-06-18 NOTE — ED Notes (Signed)
Unable to establish PIV due to poor vascular access

## 2013-06-19 ENCOUNTER — Inpatient Hospital Stay (HOSPITAL_COMMUNITY): Payer: No Typology Code available for payment source

## 2013-06-19 ENCOUNTER — Encounter (HOSPITAL_COMMUNITY): Payer: Self-pay | Admitting: General Practice

## 2013-06-19 LAB — CK TOTAL AND CKMB (NOT AT ARMC)
CK, MB: 1.7 ng/mL (ref 0.3–4.0)
Relative Index: INVALID (ref 0.0–2.5)

## 2013-06-19 LAB — BASIC METABOLIC PANEL
CO2: 24 mEq/L (ref 19–32)
Calcium: 9.3 mg/dL (ref 8.4–10.5)
Chloride: 110 mEq/L (ref 96–112)
Creatinine, Ser: 0.82 mg/dL (ref 0.50–1.10)
GFR calc Af Amer: >90 mL/min (ref >90)
GFR calc non Af Amer: 79 mL/min — ABNORMAL LOW (ref >90)
Potassium: 4.2 mEq/L (ref 3.5–5.1)
Sodium: 143 mEq/L (ref 135–145)

## 2013-06-19 LAB — SEDIMENTATION RATE: Sed Rate: 10 mm/hr (ref 0–22)

## 2013-06-19 LAB — C-REACTIVE PROTEIN: CRP: 0.6 mg/dL — ABNORMAL HIGH (ref <0.60)

## 2013-06-19 MED ORDER — PNEUMOCOCCAL VAC POLYVALENT 25 MCG/0.5ML IJ INJ
0.5000 mL | INJECTION | INTRAMUSCULAR | Status: DC
  Filled 2013-06-19 (×2): qty 0.5

## 2013-06-19 NOTE — H&P (Signed)
  Date: 06/19/2013  Patient name: Kelly Callahan  Medical record number: [id number]  Date of birth: 02-10-1956   I have seen and evaluated Kelly Callahan and discussed their care with the Residency Team.   Assessment and Plan: I have seen and evaluated the patient as outlined above. I agree with the formulated Assessment and Plan as detailed in the residents' admission note, with the following changes:   1. Subacute rash - it seems to be non steroid responsive and worsened after steroids were stopped. She has no h/o psoriasis but that has to be on diff dx. Psoriasis can worsen after systemic steroids are D/C'd and can be first manifestation of HIV infxn. Will check HIV, may need viral load if Ab negative, punch bx, and take pics to run by derm. 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 Lab studies. Cutaneous T cell lymphoma but CBC (except eos) is nl. Drug rxn / DRESS is also possible but would have responded to steroids and no systemic sxs to dx DRESS.   Burns Spain, MD 12/1/20143:05 PM

## 2013-06-19 NOTE — Progress Notes (Signed)
UR review completed. 

## 2013-06-19 NOTE — Progress Notes (Signed)
   I have seen the patient and reviewed the daily progress note by Floy Sabina MS and discussed the care of the patient with them.  See below for documentation of my findings, assessment, and plans.  Kennis Carina, MD 06/19/2013, 4:18 PM

## 2013-06-19 NOTE — Progress Notes (Signed)
Subjective:  Patient continues to endorse itching on her neck and face.  She states that this started when she took Levaquin a few weeks ago. There has been minimal improvement since then. She still endorses swelling and redness in her right leg but denies pain at this time. She thinks that the redness has moved up to the back of her leg and above her knee. She denies fever, chills, or N/V. Denies joint/bone pain or changes in bowel movements. Denies chest pain or SOB.    Objective: Vital signs in last 24 hours: Filed Vitals:   06/18/13 1645 06/18/13 1803 06/18/13 2125 06/19/13 0601  BP: 141/75 132/77 122/59 129/74  Pulse: 95 90 83 72  Temp:  99.3 F (37.4 C) 97.7 F (36.5 C) 98 F (36.7 C)  TempSrc:  Oral Oral Oral  Resp:  16 17 17   Weight:      SpO2: 98% 98% 95% 99%   Weight change:   Intake/Output Summary (Last 24 hours) at 06/19/13 1143 Last data filed at 06/18/13 2126  Gross per 24 hour  Intake    420 ml  Output      0 ml  Net    420 ml   Physical Exam:   General: NAD, resting in bed, active in conversation  HEENT: PERRL, extraocular movements intact, vision grossly intact, atopic appearance of periocular area and neck; no conjunctival erythema, no decreased visual acuity. oropharynx clear and non-erythematous Neck: no lymphadenopathy Lungs: clear to ascultation bilaterally, no crackles, wheezing or rhonchi, no increased work of breathing noted  Heart: regular rate and rhythm, no murmurs, gallops, or rubs Abdomen: bowel sounds present, soft, non-tender, non-distended Extremities: right lower extremity erythematous and edematous up to ~2 inches inferior to knee and progressing to dorsal aspect of thigh with marked edema of lateral ankle, nontender, skin flaky with some fissuring. Diffuse plaques on forearms and back with scaling.  2+ dorsalis pedis and posterior tibial pulses bilaterally, no cyanosis, clubbing Neurologic: alert & oriented X3, strength grossly intact,  sensation intact to light touch   Lab Results: CBC    Component Value Date/Time   WBC 8.1 06/18/2013 1334   RBC 3.79* 06/18/2013 1334   HGB 12.8 06/18/2013 1334   HCT 36.7 06/18/2013 1334   PLT 205 06/18/2013 1334   MCV 96.8 06/18/2013 1334   MCH 33.8 06/18/2013 1334   MCHC 34.9 06/18/2013 1334   RDW 15.5 06/18/2013 1334   LYMPHSABS 1.9 06/18/2013 1334   MONOABS 0.7 06/18/2013 1334   EOSABS 1.5* 06/18/2013 1334   BASOSABS 0.1 06/18/2013 1334   CMP     Component Value Date/Time   NA 143 06/19/2013 0845   K 4.2 06/19/2013 0845   CL 110 06/19/2013 0845   CO2 24 06/19/2013 0845   GLUCOSE 108* 06/19/2013 0845   BUN 21 06/19/2013 0845   CREATININE 0.82 06/19/2013 0845   CALCIUM 9.3 06/19/2013 0845   PROT 6.3 06/18/2013 1334   ALBUMIN 3.2* 06/18/2013 1334   AST 17 06/18/2013 1334   ALT 23 06/18/2013 1334   ALKPHOS 58 06/18/2013 1334   BILITOT 0.4 06/18/2013 1334   GFRNONAA 79* 06/19/2013 0845   GFRAA >90 06/19/2013 0845    Erythrocyte Sedimentation Rate     Component Value Date/Time   ESRSEDRATE 10 06/19/2013 0845   Studies/Results: Dg Ankle Complete Right  06/18/2013   CLINICAL DATA:  Leg swelling  EXAM: RIGHT ANKLE - COMPLETE 3+ VIEW  COMPARISON:  None.  FINDINGS: There is prominent subcutaneous  soft tissue swelling at the level of the distal half of the tibia and fibula and marked diffuse swelling about the ankle. The bones appear osteopenic. No fracture or focal bony abnormality.  IMPRESSION: Diffuse subcutaneous edema swelling of the visualized lower extremity. No acute bony abnormality.   Electronically Signed   By: Britta Mccreedy M.D.   On: 06/18/2013 13:16   Dg Foot Complete Right  06/18/2013   CLINICAL DATA:  Leg swelling.  Stone back in 6 2 weeks ago.  EXAM: RIGHT FOOT COMPLETE - 3+ VIEW  COMPARISON:  Ankle radiographs 06/18/2013  FINDINGS: The bones are diffusely osteopenic. The joints are located. No acute or healing fracture is identified. There is diffuse soft tissue  swelling, particularly along the dorsum of the foot.  IMPRESSION: Diffuse soft tissue swelling.  No acute bony abnormality identified.   Electronically Signed   By: Britta Mccreedy M.D.   On: 06/18/2013 13:17   Medications: I have reviewed the patient's current medications. Scheduled Meds: . enoxaparin (LOVENOX) injection  40 mg Subcutaneous Q24H  . nicotine  7 mg Transdermal Daily  . [START ON 06/20/2013] pneumococcal 23 valent vaccine  0.5 mL Intramuscular Tomorrow-1000   Continuous Infusions:  PRN Meds:.diphenhydrAMINE, HYDROcodone-acetaminophen, hydrOXYzine  Assessment/Plan:  1) RLE erythema and swelling- These symptoms have been going on for 3 weeks following insect bite of dorsal foot approximately one month ago while patient was in [place]. On exam today, the RLE was erythematous and slightly warm to touch. The skin is dry with significant flaking. Etiology is unclear but differential includes cellulitis vs. contact dermatitis vs. chronic atopic dermatitis vs. evenomation following spider bite. Right foot and ankle xrays showed diffuse soft tissue swelling but no acute bony abnormality. RLE Doppler showed no evidence of DVT, superficial thrombosis, or Baker's cyst. She received one dose of vancomycin, Decadron 10 mg IV in ED.  -Discontinue vancomycin as this is unlikely infectious process and patient showing no signs of systemic infection.  -Continue Norco 5-235 mg q6h prn pain  -Discontinue Hydroxyzine 25 mg QID prn itching per patient request. Continue diphenhydramine 25 mg QID prn for itching.  -BMP in AM   2) Face/neck erythema and itching- Patient has patchy erythematous skin on face and neck/shoulders with clearly defined outline on should.  She states began after immediately starting IV Levaquin (05/30/13). She still endorses itching that has minimally improved with Benadryl. This may represent a drug reaction, though time course less convincing. Added Levoquin to her allergy list. Also  on the differential includes dermatomyositis vs other systemic dermatologic process (per above) that is possibly related to patient's other skin findings. No muscle weakness or joint pain.  -Diphenhydramine 25 mg PO QID prn for itching. Will consider topical corticosteroid if this does not improve.  -Will order CK-MB, aldolase, LDH and ANA. If these are positive with consider more specific antibody testing to help rule-out dermatomyositis. CRP and ESR were normal.   2) Back and forearm plaques:  These small and diffuse scaling plaques located throughout lower back and forearms. Differential includes erythrodermic psorasis vs nummular dermatitis (eczema) vs chronic atopic dermatitis. Patient endorses a past history of eczema, but denies any known history of psoriasis. She denies any itching in these locations. Will continue to monitor this closely in context of patient's other skin findings. Will consider topical corticosteroid. Will also consider biopsy if etiology remains unclear.   3)Tobacco abuse- Patient smokes 1/2 PPD.  -smoking cessation counseling  -nicotine patch   DVT PPX- lovenox  Code status- Full code   Dispo: Disposition is deferred at this time, awaiting improvement of current medical problems. Anticipated discharge in approximately 1-2 day(s).   The patient does not have a current PCP (Pcp Not In System) and does need an [facility] hospital follow-up appointment after discharge.   This is a Psychologist, occupational Note.     LOS: 1 day   Floy Sabina, Med Student 06/19/2013, 11:43 AM

## 2013-06-19 NOTE — Progress Notes (Signed)
Subjective: Complaints of itching, not relieved with benedryl. Says prednisone helped when she got it initially. Says lesions on her legs and face, and forearms have not improved.    Objective: Vital signs in last 24 hours: Filed Vitals:   06/18/13 1645 06/18/13 1803 06/18/13 2125 06/19/13 0601  BP: 141/75 132/77 122/59 129/74  Pulse: 95 90 83 72  Temp:  99.3 F (37.4 C) 97.7 F (36.5 C) 98 F (36.7 C)  TempSrc:  Oral Oral Oral  Resp:  16 17 17   Weight:      SpO2: 98% 98% 95% 99%   Weight change:   Intake/Output Summary (Last 24 hours) at 06/19/13 1501 Last data filed at 06/18/13 2126  Gross per 24 hour  Intake    420 ml  Output      0 ml  Net    420 ml   General appearance: alert, cooperative and appears stated age Head: Normocephalic, without obvious abnormality, atraumatic, skin on face appears dry, scalling present. Neck: Posterior neck - scaly heaped up silvery scales present on upper neck region. Back: symmetric, no curvature. ROM normal. No CVA tenderness. Lungs: clear to auscultation bilaterally Heart: S1, S2 normal Abdomen: soft, non-tender; bowel sounds normal; no masses,  no organomegaly Extremities: Rt lower extremity, edematous, skin appears dry, scally, extending form toes to lower third of the leg, non pitting. Lt leg appears normal, hyperpig patches on the flexor aspect of the elbows. Neurologic: Alert and oriented X 3, normal strength and tone. Normal symmetric reflexes. Normal coordination and gait  Lab Results: Basic Metabolic Panel:  Recent Labs Lab 06/18/13 1334 06/19/13 0845  NA 143 143  K 3.9 4.2  CL 109 110  CO2 25 24  GLUCOSE 82 108*  BUN 19 21  CREATININE 1.09 0.82  CALCIUM 8.9 9.3   Liver Function Tests:  Recent Labs Lab 06/18/13 1334  AST 17  ALT 23  ALKPHOS 58  BILITOT 0.4  PROT 6.3  ALBUMIN 3.2*   No results found for this basename: LIPASE, AMYLASE,  in the last 168 hours No results found for this basename: AMMONIA,   in the last 168 hours CBC:  Recent Labs Lab 06/18/13 1334  WBC 8.1  NEUTROABS 4.0  HGB 12.8  HCT 36.7  MCV 96.8  PLT 205   Micro Results: No results found for this or any previous visit (from the past 240 hour(s)). Studies/Results: Dg Ankle Complete Right  06/18/2013   CLINICAL DATA:  Leg swelling  EXAM: RIGHT ANKLE - COMPLETE 3+ VIEW  COMPARISON:  None.  FINDINGS: There is prominent subcutaneous soft tissue swelling at the level of the distal half of the tibia and fibula and marked diffuse swelling about the ankle. The bones appear osteopenic. No fracture or focal bony abnormality.  IMPRESSION: Diffuse subcutaneous edema swelling of the visualized lower extremity. No acute bony abnormality.   Electronically Signed   By: Britta Mccreedy M.D.   On: 06/18/2013 13:16   Dg Foot Complete Right  06/18/2013   CLINICAL DATA:  Leg swelling.  Stone back in 6 2 weeks ago.  EXAM: RIGHT FOOT COMPLETE - 3+ VIEW  COMPARISON:  Ankle radiographs 06/18/2013  FINDINGS: The bones are diffusely osteopenic. The joints are located. No acute or healing fracture is identified. There is diffuse soft tissue swelling, particularly along the dorsum of the foot.  IMPRESSION: Diffuse soft tissue swelling.  No acute bony abnormality identified.   Electronically Signed   By: Vinetta Bergamo.D.  On: 06/18/2013 13:17   Medications: I have reviewed the patient's current medications.  Scheduled Meds: . enoxaparin (LOVENOX) injection  40 mg Subcutaneous Q24H  . nicotine  7 mg Transdermal Daily  . [START ON 06/20/2013] pneumococcal 23 valent vaccine  0.5 mL Intramuscular Tomorrow-1000   Continuous Infusions:  PRN Meds:.diphenhydrAMINE, HYDROcodone-acetaminophen, hydrOXYzine Assessment/Plan: Principal Problem:   Cellulitis of right lower extremity Active Problems:   Cellulitis of right lower leg  #RLE erythema and swelling- 3 week hx, considerations at this time- Psoriasis Vs Atopic dermatitis vs dermatomyositis. Right  foot and ankle xrays showed diffuse soft tissue swelling but no acute bony abnormality. RLE Doppler showed no evidence of DVT, superficial thrombosis, or Baker's cyst. CBC- normal- WBC, also Appearance of foot lesion appears chronic, no sign of active infection, so vanc d/c for now. Considered doing a skin biopsy, but pt says she has had one done before. Will try to obtain records from her doctor.  -Discontinue vancomycin for now  -Norco 5-235 mg q6h prn pain  -hydroxyzine 25 mg QID prn itching  - ANA, LDH, Aldolase, CK- total- help rule out rheumatologic dx, though no joint pain, no systemic manif, and r/o dermatomyositis.   #Possible drug reaction- Patient has patchy erythematous skin on face and neck which she states began after taking Levoquin, now improved per patient. This may represent a drug reaction though time course less convincing. Added Levaquin to her allergy list.  -Benadryl 25 mg PO q8h itching  - Pt still complaints of itching.  #Tobacco abuse- Patient smokes 1/2 PPD.  -smoking cessation counseling  -nicotine patch   #DVT PPX- lovenox  #Code status- Full code  Dispo: Disposition is deferred at this time, awaiting improvement of current medical problems.   The patient does not know have a current PCP (Pcp Not In System) and does not know need an [facility] hospital follow-up appointment after discharge.  The patient does not know have transportation limitations that hinder transportation to clinic appointments.  .Services Needed at time of discharge: Y = Yes, Blank = No PT:   OT:   RN:   Equipment:   Other:     LOS: 1 day   Kennis Carina, MD 06/19/2013, 3:01 PM

## 2013-06-20 DIAGNOSIS — L02419 Cutaneous abscess of limb, unspecified: Secondary | ICD-10-CM

## 2013-06-20 LAB — CBC WITH DIFFERENTIAL/PLATELET
Basophils Absolute: 0.1 10*3/uL (ref 0.0–0.1)
Basophils Relative: 1 % (ref 0–1)
Hemoglobin: 12.8 g/dL (ref 12.0–15.0)
MCH: 33.7 pg (ref 26.0–34.0)
MCHC: 33.9 g/dL (ref 30.0–36.0)
Monocytes Relative: 9 % (ref 3–12)
Neutro Abs: 2.5 10*3/uL (ref 1.7–7.7)
Neutrophils Relative %: 32 % — ABNORMAL LOW (ref 43–77)
Platelets: 226 10*3/uL (ref 150–400)
WBC: 7.9 10*3/uL (ref 4.0–10.5)

## 2013-06-20 LAB — ANA: Anti Nuclear Antibody(ANA): NEGATIVE

## 2013-06-20 MED ORDER — DIPHENHYDRAMINE-ZINC ACETATE 2-0.1 % EX CREA
1.0000 "application " | TOPICAL_CREAM | Freq: Every day | CUTANEOUS | Status: DC | PRN
  Administered 2013-06-20: 1 via TOPICAL
  Filled 2013-06-20: qty 28

## 2013-06-20 MED ORDER — WHITE PETROLATUM GEL
Status: AC
  Administered 2013-06-20: 1
  Filled 2013-06-20: qty 5

## 2013-06-20 NOTE — Progress Notes (Signed)
Skin biopsy performed this afternoon. Lidocaine 1% vial removed from pyxis via override per verbal order from Internal Medicine Teaching Service team for use during procedure. Specimen sent to pathology, results pending.

## 2013-06-20 NOTE — Progress Notes (Signed)
  I have seen and examined the patient, and reviewed the daily progress note by Floy Sabina, MS and discussed the care of the patient with them. Please see my progress note from 06/20/2013 for further details regarding assessment and plan.    Signed:  Kennis Carina, MD 06/20/2013, 8:29 PM \

## 2013-06-20 NOTE — Progress Notes (Signed)
Subjective:  Patient reports that she did not sleep well due to itching on neck and back. She denies pain or increased swelling in the leg, but continues to endorse dry skin. She has no pain with walking but says that her foot feels "numb" in the area of swelling. She denies fever, chills, nausea or vomiting. Denies fatigue, night sweats, weakness or joint pain. Denies chest pain of SOB. Bowel movements are "normal."  Objective: Vital signs in last 24 hours: Filed Vitals:   06/19/13 0601 06/19/13 1602 06/19/13 2047 06/20/13 0522  BP: 129/74 120/67 114/66 136/76  Pulse: 72 80 86 70  Temp: 98 F (36.7 C) 99.1 F (37.3 C) 98.5 F (36.9 C) 98.5 F (36.9 C)  TempSrc: Oral     Resp: 17 18 18 18   Weight:      SpO2: 99% 100% 96% 100%   Weight change:   Intake/Output Summary (Last 24 hours) at 06/20/13 1125 Last data filed at 06/20/13 0644  Gross per 24 hour  Intake    840 ml  Output      2 ml  Net    838 ml   Physical Exam:  General: NAD, resting in bed  HEENT: PERRL, erythema and dry flaky skin in neck and shoulders, no conjunctival erythema, no decreased visual acuity, oropharynx clear and non-erythematous Neck: no LAD Lungs: clear to ascultation bilaterally, no crackles, wheezing or rhonchi, no increased work of breathing noted  Heart: regular rate and rhythm, no murmurs, gallops, or rubs Abdomen: bowel sounds present, soft, non-tender Extremities: right lower extremity is erythematous and edematous up to ~2 inches inferior to knee and progressing to dorsal aspect of thigh with marked edema of lateral ankle, nontender, skin flaky with some fissuring. Unchanged in last 24 hrs. Diffuse plaques on forearms and back with scaling. 2+ dorsalis pedis and posterior tibial pulses bilaterally. Neurologic: alert & oriented X3  Lab Results:  CBC    Component Value Date/Time   WBC 7.9 06/20/2013 1220   RBC 3.80* 06/20/2013 1220   HGB 12.8 06/20/2013 1220   HCT 37.8 06/20/2013 1220   PLT  226 06/20/2013 1220   MCV 99.5 06/20/2013 1220   MCH 33.7 06/20/2013 1220   MCHC 33.9 06/20/2013 1220   RDW 15.7* 06/20/2013 1220   LYMPHSABS 2.9 06/20/2013 1220   MONOABS 0.7 06/20/2013 1220   EOSABS 1.7* 06/20/2013 1220   BASOSABS 0.1 06/20/2013 1220   ANA: Negative CK: 59 U/L LDH: 196 U/L  HIV antibody: negative   Studies/Results:  CXR (06/19/13):   FINDINGS:  Normal cardiac silhouette with ectatic aorta. There are linear  markings at the left and right lung base. There is irregular  calcifications seen on the lateral projection inferior to the hila.  No focal consolidation. No mass seen.  IMPRESSION:  1. Linear markings at lung bases could represent edema or  bronchitis.  2. Calcifications inferior to the hilum on a lateral projection  could be pleural or mediastinal. Consider CT thorax for further  evaluation.   Medications: I have reviewed the patient's current medications. Scheduled Meds: . enoxaparin (LOVENOX) injection  40 mg Subcutaneous Q24H  . nicotine  7 mg Transdermal Daily  . pneumococcal 23 valent vaccine  0.5 mL Intramuscular Tomorrow-1000  . white petrolatum       Continuous Infusions:  PRN Meds:.diphenhydrAMINE, diphenhydrAMINE-zinc acetate, HYDROcodone-acetaminophen, hydrOXYzine Assessment/Plan:  1) RLE erythema and swelling- These symptoms have been going on for 3 weeks following insect bite of dorsal foot approximately one  month ago while patient was in [place].On exam, the RLE was erythematous and slightly warm to touch. The skin is dry with significant flaking. This has been unchanged since yesterday. Etiology is unclear, but differential includes contact dermatitis vs. chronic atopic dermatitis vs. evenomation following spider bite vs cellulitis. Right foot and ankle xrays showed diffuse soft tissue swelling but no acute bony abnormality. RLE Doppler showed no evidence of DVT, superficial thrombosis, or Baker's cyst. She received one dose of vancomycin,  Decadron 10 mg IV in ED. Vancomycin was discontinued yesterday as this does not appear infectious based on time course and patient showing no signs for systemic infection. She remains afebrile today.  - Will consult [facility] dermatology for guidance in further management as this may be part of systemic dermatological process.  -Continue Norco 5-235 mg q6h prn pain  -BMP in AM   2) Face/neck erythema and itching- Patient has patchy erythematous skin on face and neck/shoulders with clearly defined outline on shoulder. She states that this began after immediately starting IV Levaquin (05/30/13). She still endorses itching that has not improved with Benadryl or Vistaril. This may represent a drug reaction, though time course less convincing. Added Levoquin to her allergy list. Also on the differential includes dermatomyositis vs other systemic dermatologic process (per below) that is possibly related to patient's other skin findings. Patient does have shawl pattern of rash on shoulders and V-shaped rash on chest. No muscle weakness or joint pain.  -Continue Hydroxyzine 25 mg QID prn itching. Continue diphenhydramine 25 mg QID prn for itching. Patient has not had much improvement with this so will provide topical diphenhydramine for itching and dryness.  - Began workup for possible diagnosis of dermatomyositis.  CK, LDH, were normal.  ANA was also negative. Aldolase is pending. These findings are not consistent with dermatomyositis.  -Skin biopsy was performed today as etiology is still unknown. Will consult [facility] dermatology for guidance in further management.   2) Back and forearm plaques: There are small and diffuse scaling plaques located throughout lower back and forearms. Differential includes erythrodermic psorasis vs nummular dermatitis (eczema) vs chronic atopic dermatitis. Patient endorses a past history of eczema, but denies any known history of psoriasis. She denies any itching in these locations. Will  continue to monitor this closely in context of patient's other skin findings. Will consider topical corticosteroid. Biopsy on rash shoulders was performed today and will follow-up with results. It possible that this is related to skin findings on neck and shoulders.   3)Tobacco abuse- Patient smokes 1/2 PPD.  -smoking cessation counseling  -nicotine patch   DVT PPX- lovenox  Code status- Full code   Dispo: Disposition is deferred at this time, awaiting improvement of current medical problems. Anticipated discharge in approximately 1-2 day(s).    This is a Psychologist, occupational Note.  The care of the patient was discussed with Dr. Mariea Clonts and the assessment and plan formulated with their assistance.  Please see their attached note for official documentation of the daily encounter.   LOS: 2 days   Floy Sabina, Med Student 06/20/2013, 11:25 AM

## 2013-06-20 NOTE — Progress Notes (Addendum)
Subjective: Still Complaints of itching, not relieved with benedryl. Says prednisone helped when she got it initially. Says lesions on her legs and face, and forearms have not improved, mostly remained the same. She denies fever, chills, nausea or vomiting. Denies fatigue, night sweats, weakness or joint pain, no chest pain of SOB.  Objective: Vital signs in last 24 hours: Filed Vitals:   06/19/13 1602 06/19/13 2047 06/20/13 0522 06/20/13 1254  BP: 120/67 114/66 136/76 131/70  Pulse: 80 86 70 69  Temp: 99.1 F (37.3 C) 98.5 F (36.9 C) 98.5 F (36.9 C) 97.9 F (36.6 C)  TempSrc:      Resp: 18 18 18 18   Weight:      SpO2: 100% 96% 100% 100%   Weight change:   Intake/Output Summary (Last 24 hours) at 06/20/13 1931 Last data filed at 06/20/13 1230  Gross per 24 hour  Intake   1080 ml  Output      2 ml  Net   1078 ml   General appearance: alert, cooperative and appears stated age Head: Normocephalic, without obvious abnormality,PERRL, atraumatic, skin on face appears dry, scalling present, on face, ear region and neck, extending to her upper chest and upper back. Neck: Posterior neck - scaly heaped up scales present on upper neck region- posterioly. Back: symmetric, no curvature. ROM normal. No CVA tenderness. Lungs: clear to auscultation bilaterally Heart: S1, S2 normal Abdomen: soft, non-tender; bowel sounds normal; no masses,  no organomegaly Extremities: Rt lower extremity, edematous, skin appears dry, scally, extending form toes to lower third of the leg, non pitting, extending posteriorly to her lower thighs. Lt leg appears normal, hyperpig patches on the flexor aspect of the elbows. Neurologic: Alert and oriented X 3, normal strength and tone. Normal symmetric reflexes. Normal coordination and gait  Lab Results: Basic Metabolic Panel:  Recent Labs Lab 06/18/13 1334 06/19/13 0845  NA 143 143  K 3.9 4.2  CL 109 110  CO2 25 24  GLUCOSE 82 108*  BUN 19 21    CREATININE 1.09 0.82  CALCIUM 8.9 9.3   Liver Function Tests:  Recent Labs Lab 06/18/13 1334  AST 17  ALT 23  ALKPHOS 58  BILITOT 0.4  PROT 6.3  ALBUMIN 3.2*   CBC:  Recent Labs Lab 06/18/13 1334 06/20/13 1220  WBC 8.1 7.9  NEUTROABS 4.0 2.5  HGB 12.8 12.8  HCT 36.7 37.8  MCV 96.8 99.5  PLT 205 226   Studies/Results: Dg Chest 2 View  06/19/2013   CLINICAL DATA:  Smoker, rule out lung mass  EXAM: CHEST  2 VIEW  COMPARISON:  None.  FINDINGS: Normal cardiac silhouette with ectatic aorta. There are linear markings at the left and right lung base. There is irregular calcifications seen on the lateral projection inferior to the hila. No focal consolidation. No mass seen.  IMPRESSION: 1. Linear markings at lung bases could represent edema or bronchitis. 2. Calcifications inferior to the hilum on a lateral projection could be pleural or mediastinal. Consider CT thorax for further evaluation.   Electronically Signed   By: Genevive Bi M.D.   On: 06/19/2013 18:20   Medications: I have reviewed the patient's current medications.  Scheduled Meds: . enoxaparin (LOVENOX) injection  40 mg Subcutaneous Q24H  . nicotine  7 mg Transdermal Daily  . pneumococcal 23 valent vaccine  0.5 mL Intramuscular Tomorrow-1000   Continuous Infusions:  PRN Meds:.diphenhydrAMINE, diphenhydrAMINE-zinc acetate, HYDROcodone-acetaminophen, hydrOXYzine Assessment/Plan: Principal Problem:   Cellulitis of right lower  extremity Active Problems:   Cellulitis of right lower leg  #RLE erythema and swelling And Facial Rash- 3 week hx, considerations at this time- Psoriasis Vs Chronic Atopic dermatitis vs dermatomyositis vs Contact dermatitis Vs cellultitis Vs Envenomation. Right foot and ankle xrays showed diffuse soft tissue swelling but no acute bony abnormality. RLE Doppler showed no evidence of DVT, superficial thrombosis, or Baker's cyst. CBC- normal- WBC, also Appearance of foot lesion appears chronic,  no sign of active infection, so vanc d/c for now. No joint pain, no muscle weakness, and no obvious lesions suggestive of gottrons papules on fingers. No records for previous biopsy. ANA- Normal, LDH- WNL, Aldolase- pending, CK- normal, dermatomyositis unlikley with normal Ck levels. - Biopsy today. - Norco 5-235 mg q6h prn pain  -hydroxyzine 25 mg QID prn itching  - Diphenhydramine cream to apply to lesions for itching. - Consult Dermatology for further guidance in management. - Repeat CBC to ensure WBC is not increasing due to presence of infection.   #Tobacco abuse- Patient smokes 1/2 PPD.  -smoking cessation counseling  -nicotine patch   #DVT PPX- lovenox  #Code status- Full code  Dispo: Disposition is deferred at this time, awaiting improvement of current medical problems.   The patient does not know have a current PCP (Pcp Not In System) and does not know need an [facility] hospital follow-up appointment after discharge.  The patient does not know have transportation limitations that hinder transportation to clinic appointments.  .Services Needed at time of discharge: Y = Yes, Blank = No PT:   OT:   RN:   Equipment:   Other:     LOS: 2 days   Kennis Carina, MD 06/20/2013, 7:31 PM

## 2013-06-20 NOTE — Procedures (Signed)
Procedure Note for Skin Biopsy  Site: Right posterior back Size of growth: generalized rash  Consent was signed.  The skin was cleaned with alcohol, and local anesthesia with lidocaine was performed.  The specimen was collected with a punch biopsy.  The site was closed with hemostasis with non-absorbable single stitch.  The site was dressed with Vaseline and a bandage.  The patient tolerated the procedure well.  Assessment:   1. Status-post biopsy: Stable. No bleeding.    Plan: 1. Wound care reviewed with the patient. 2.  Stitch will be removed after 5-7 days 2. Follow-up to be scheduled when the pathology results are available.   Signed:  Dow Adolph, MD PGY-2 Internal Medicine Teaching Service Pager: [phone] 06/20/2013, 11:01 PM

## 2013-06-21 LAB — ALDOLASE: Aldolase: 6.2 U/L (ref <=8.1)

## 2013-06-21 MED ORDER — TRIAMCINOLONE ACETONIDE 0.1 % EX CREA
TOPICAL_CREAM | Freq: Three times a day (TID) | CUTANEOUS | Status: DC
  Administered 2013-06-21: 15:00:00 via TOPICAL
  Filled 2013-06-21: qty 15

## 2013-06-21 MED ORDER — PREDNISONE 20 MG PO TABS: 40.0000 mg | ORAL_TABLET | Freq: Every day | ORAL | Status: DC

## 2013-06-21 MED ORDER — DIPHENHYDRAMINE HCL 25 MG PO CAPS: 25.0000 mg | ORAL_CAPSULE | Freq: Four times a day (QID) | ORAL | Status: DC | PRN

## 2013-06-21 NOTE — Progress Notes (Signed)
Subjective:  Patient reports that she had difficulty sleeping night due to itching in neck and face. She says that Benadryl does help, but that her face is still dry and swollen. She denies fevers, chills, chest pain, SOB or difficulty speaking. She thinks that that the her ankle looks that same as yesterday. Denies pain or discharge from ankle. Denies fatigue, muscles weakness or joint pain. She would like to go home.   Objective: Vital signs in last 24 hours: Filed Vitals:   06/20/13 1254 06/20/13 2107 06/21/13 0637 06/21/13 1301  BP: 131/70 129/61 132/64 127/70  Pulse: 69 98 92 84  Temp: 97.9 F (36.6 C) 97.7 F (36.5 C) 98.9 F (37.2 C) 99.1 F (37.3 C)  TempSrc:      Resp: 18 18 18 18   Weight:      SpO2: 100% 99% 99% 98%   Weight change:   Intake/Output Summary (Last 24 hours) at 06/21/13 1422 Last data filed at 06/21/13 1300  Gross per 24 hour  Intake    840 ml  Output      3 ml  Net    837 ml   Physical Exam:  General: NAD, resting in bed  HEENT: Face is erythematous and swollen. Patient applied Vaseline on face and ear prior to exam. No conjunctival erythema, no decreased visual acuity, oropharynx clear and non-erythematous. Neck: Erythema and dry flaky skin, no LAD. Lungs: clear to ascultation bilaterally, no crackles, wheezing or rhonchi, no increased work of breathing noted  Heart: regular rate and rhythm, no murmurs, gallops, or rubs Abdomen: bowel sounds present, soft, non-tender  Extremities: right lower extremity is erythematous and edematous up to ~2 inches inferior to knee and progressing to dorsal aspect of medial thigh with marked edema of lateral ankle, nontender, skin flaky with some fissuring. Unchanged in last 48 hrs. 2+ dorsalis pedis and posterior tibial pulses bilaterally. Skin:  Diffuse plaques on forearms and back with scaling.  Erythematous coalescing macular rash with dry skin on shoulders in shawl like pattern that extends anterior to chest in  V-shaped pattern.  Neurologic: alert & oriented X3  Lab Results: No new labs in last 24 hours.   Micro Results: No results found for this or any previous visit (from the past 240 hour(s)). Studies/Results: Dg Chest 2 View  06/19/2013   CLINICAL DATA:  Smoker, rule out lung mass  EXAM: CHEST  2 VIEW  COMPARISON:  None.  FINDINGS: Normal cardiac silhouette with ectatic aorta. There are linear markings at the left and right lung base. There is irregular calcifications seen on the lateral projection inferior to the hila. No focal consolidation. No mass seen.  IMPRESSION: 1. Linear markings at lung bases could represent edema or bronchitis. 2. Calcifications inferior to the hilum on a lateral projection could be pleural or mediastinal. Consider CT thorax for further evaluation.   Electronically Signed   By: Genevive Bi M.D.   On: 06/19/2013 18:20   Medications: I have reviewed the patient's current medications. Scheduled Meds: . enoxaparin (LOVENOX) injection  40 mg Subcutaneous Q24H  . nicotine  7 mg Transdermal Daily  . pneumococcal 23 valent vaccine  0.5 mL Intramuscular Tomorrow-1000  . triamcinolone cream   Topical TID   Continuous Infusions:  PRN Meds:.diphenhydrAMINE, diphenhydrAMINE-zinc acetate, HYDROcodone-acetaminophen, hydrOXYzine Assessment/Plan:  1) RLE erythema and swelling- These symptoms have been going on for 3 weeks following insect bite of dorsal foot approximately one month ago while patient was in [place]. On exam, the RLE  was erythematous and slightly warm to touch. The skin is dry with significant flaking. This has remained unchanged during admission. There is no pain or discharge. Etiology is still unclear, but differential includes contact dermatitis vs. chronic atopic dermatitis vs. evenomation following spider bite vs cellulitis. Right foot and ankle xrays showed diffuse soft tissue swelling but no acute bony abnormality. RLE Doppler showed no evidence of DVT,  superficial thrombosis, or Baker's cyst. She received one dose of vancomycin, Decadron 10 mg IV in ED. Vancomycin was discontinued after 1 dose in ED as this does not appear infectious based on time course and patient showing no signs for systemic infection. She remained afebrile during admission.   -  Advised patient to follow-up with dermatology for guidance in further management as this may be part of systemic dermatological process.  -Continue Norco 5-325 mg q6h prn pain   2) Face/neck erythema and itching- Patient has erythematous coalescing macular rash on face and neck/shoulders with clearly defined shawl like outline on shoulder that extends anterior into V-shaped pattern on chest. She states that this began after immediately starting IV Levaquin (05/30/13). She still endorses itching that has slightly improved with Benadryl. This may represent a drug reaction, though time course less convincing. Added Levoquin to her allergy list. Also on the differential includes dermatomyositis vs other systemic dermatologic process (per below) that was triggered by immune response after insect bite and is possibly related to patient's other skin findings.  Patient does have shawl pattern of rash on shoulders and V-shaped rash on chest suggestive on dermatomyositis. No muscle weakness or joint pain.  -Began workup for possible diagnosis of dermatomyositis. AST/ALT, CK, LDH, were normal. ANA was also negative. Aldolase is pending. These negative findings are not consistent with dermatomyositis. 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  -Skin biopsy was performed on 12/2 as etiology is still unknown. Results are pending. Will schedule follow-up at [facility] clinic.  -Will begin 10 day course of oral prednisone with taper: 40 mg for days 1-5,  20 mg  for days 6-7, 10 mg for days 8-9 and 5mg  on day 10.  -Continue Hydroxyzine 25 mg QID and diphenhydramine 25 mg QID prn for itching.  3) Back and forearm plaques: There are small and diffuse scaling plaques located throughout lower back and forearms. Differential includes erythrodermic psorasis vs nummular dermatitis (eczema) vs chronic atopic dermatitis. Patient endorses a past history of eczema, but denies any known history of psoriasis. She denies any itching in these locations. It possible that this is related to skin findings on neck and shoulders. Will begin oral prednisone taper per above.  4)Tobacco abuse- Patient smokes 1/2 PPD.  -smoking cessation counseling  -nicotine patch   DVT PPX- d/c Lovenox at discharge  Code status- Full code   Dispo: Will discharge home today. Patient has follow-up appointment with Internal Medicine Clinic at [facility] on 06/29/13.    This is a Psychologist, occupational Note.  The care of the patient was discussed with Dr Mariea Clonts and the assessment and plan formulated with their assistance.  Please see their attached note for official documentation of the daily encounter.   LOS: 3 days   Floy Sabina, Med Student 06/21/2013, 2:22 PM

## 2013-06-21 NOTE — Progress Notes (Signed)
  Date: 06/21/2013  Patient name: Kelly Callahan  Medical record number: [id number]  Date of birth: [birth year]   This patient has been seen and the plan of care was discussed with the house staff. Please see their note for complete details. I concur with their findings with the following additions/corrections: Ms Weimann' brother was visiting. Overnight, increased swelling of her face and R ear lobe. No resp distress. I spoke to Rheum at [facility] and they agreed with all that we have done and suggested derm F/U. No need for additional autoAb or tx until after path back. She will F/U with our clinic and we will attempt to arrange F/U [facility] derm. 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 Other possibilities is acute psoriasis (HIV ab negative and rash started before steroids), other unknown malignancy causing derm manifestation (CBC, CMP all nl except for eosinophilia), unknown insect bite. Stable for D/C home today.  Burns Spain, MD 06/21/2013, 3:26 PM

## 2013-06-21 NOTE — Progress Notes (Signed)
Patient requested that RN call the MD about facial swelling. MD was called and informed Rn that they would be up to see her.

## 2013-06-21 NOTE — Progress Notes (Signed)
I have seen the patient and reviewed the daily progress note by Floy Sabina MS and discussed the care of the patient with them.  See below for documentation of my findings, assessment, and plans.  Subjective: Complaints today that her face looks more swollen today and feels tight, but no lip or tongue swelling, no difficulty breathing or swallowing. Swelling on her legs has remained the same. Itching has reduced today.  Objective:  Vital signs in last 24 hours: Filed Vitals:   06/20/13 1254 06/20/13 2107 06/21/13 0637 06/21/13 1301  BP: 131/70 129/61 132/64 127/70  Pulse: 69 98 92 84  Temp: 97.9 F (36.6 C) 97.7 F (36.5 C) 98.9 F (37.2 C) 99.1 F (37.3 C)  TempSrc:      Resp: 18 18 18 18   Weight:      SpO2: 100% 99% 99% 98%   Weight change:   Intake/Output Summary (Last 24 hours) at 06/21/13 1542 Last data filed at 06/21/13 1300  Gross per 24 hour  Intake    840 ml  Output      3 ml  Net    837 ml   Physical Exam  General appearance: alert, cooperative and appears stated age  Head: Normocephalic,PERRL, atraumatic, skin on face appears dry, scalling present, on face, ear region and neck, extending to her upper chest and upper back, face appear abit swollen compared to yesterday.  Neck: Posterior neck - scaly heaped up scales present on upper neck region- posterioly.  Back: symmetric, no curvature. ROM normal. No CVA tenderness, hyperpig patches extending in a v shaped pattern down from her neck to upper back. Also involving the neck and chest anteriorly. Lungs: clear to auscultation bilaterally  Heart: S1, S2 normal  Abdomen: soft, non-tender; bowel sounds normal; no masses, no organomegaly  Extremities: Rt lower extremity, edematous, skin appears dry, scally, extending form toes to lower third of the leg, non pitting, extending posteriorly to her lower thighs. Lt leg appears normal, hyperpig patches on the flexor aspect of the elbows.  Neurologic: Alert and  oriented X 3, normal strength and tone. Normal symmetric reflexes. Normal coordination and gait.  Medications: I have reviewed the patient's current medications. Scheduled Meds: . enoxaparin (LOVENOX) injection  40 mg Subcutaneous Q24H  . nicotine  7 mg Transdermal Daily  . pneumococcal 23 valent vaccine  0.5 mL Intramuscular Tomorrow-1000  . triamcinolone cream   Topical TID   Continuous Infusions:  PRN Meds:.diphenhydrAMINE, diphenhydrAMINE-zinc acetate, HYDROcodone-acetaminophen, hydrOXYzine Assessment/Plan:  #RLE erythema and swelling And Facial Rash- 3 week hx, considerations at this time- Chronic Atopic dermatitis vs dermatomyositis vs Contact dermatitis Vs cellultitis Vs Envenomation of the Lt foot. Right foot and ankle xrays showed diffuse soft tissue swelling but no acute bony abnormality. RLE Doppler showed no evidence of DVT, superficial thrombosis, or Baker's cyst. CBC- normal- WBC, also Appearance of foot lesion appears chronic, no sign of active infection,  So no indication for antibiotics. 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 Pt might have just the skin manifestations of dermatomyositis. No joint pain, no muscle weakness. ANA- Normal, LDH- WNL, Aldolase- pending, CK- normal.  - Biopsy was done yesterday, results pending. - Norco 5-235 mg q6h prn pain. - hydroxyzine 25 mg QID prn itching.  -  Will commence Prednisone, 40mg  daily for 5 days and taper- 20mg  daily for 3 days and 10mg  daily for 2 days.  - Will discharge pt home today, with follow up in our clinic. Also set up a dermatology appointment for pt at [facility] Dec 17th at 1pm. I stressed the importance of keeping this appoitment to the pt, as she has no insurance which makes getting  appointments a challenge.  #Tobacco abuse- Patient smokes 1/2 PPD.  -smoking cessation counseling  -nicotine patch   #DVT PPX- lovenox  #Code status- Full code  Dispo: Disposition is deferred at this time, awaiting improvement of current medical problems.  Anticipated discharge in approximately today.   The patient does not have a current PCP (Pcp Not In System) and does not need an [facility] hospital follow-up appointment after discharge.  The patient does not know have transportation limitations that hinder transportation to clinic appointments.  .Services Needed at time of discharge: Y = Yes, Blank = No PT:   OT:   RN:   Equipment:   Other:     LOS: 3 days   Kennis Carina, MD 06/21/2013, 3:42 PM

## 2013-06-21 NOTE — Discharge Summary (Signed)
Name: Kelly Callahan MRN: [id number] DOB: 27-Jan-1956 57 y.o. PCP: Pcp Not In System  Date of Admission: 06/18/2013 12:14 PM Date of Discharge: 06/21/2013 Attending Physician: Burns Spain, MD  Discharge Diagnosis:  Swelling of lower extremity. Rash on body and extremities.  Discharge Medications:   Medication List    STOP taking these medications       cephALEXin 500 MG capsule  Commonly known as:  KEFLEX     HYDROcodone-acetaminophen 5-325 MG per tablet  Commonly known as:  NORCO/VICODIN      TAKE these medications       diphenhydrAMINE 25 mg capsule  Commonly known as:  BENADRYL  Take 1 capsule (25 mg total) by mouth every 6 (six) hours as needed for itching.     hydrOXYzine 25 MG tablet  Commonly known as:  ATARAX/VISTARIL  Take 25 mg by mouth 4 (four) times daily as needed for itching.     ibuprofen 200 MG tablet  Commonly known as:  ADVIL,MOTRIN  Take 400 mg by mouth every 6 (six) hours as needed for moderate pain.     predniSONE 20 MG tablet  Commonly known as:  DELTASONE  Take 2 tablets (40 mg total) by mouth daily with breakfast.        Disposition and follow-up:   Kelly Callahan was discharged from [facility] in Good condition.  At the hospital follow up visit please address:  1.  Follow up with dermatologist, appointment scheduled. Skin biopsy results, available after discharge revealed, spongitic dermatitis, and some features of allergic contact dermatitis. Also if pt right Leg swelling has improved or worsened.  2. Site of biopsy, clean, dry and healing with no erythema and induration. Non-absorbable stitch used. Please remove stitch- 5-7 days after discharge.   3. Pt still on prednisone taper?  Follow-up Appointments:     Follow-up Information   Follow up with LI, NA, MD On 06/29/2013. (06/29/13 at [facility])    Specialty:  Internal Medicine   Contact information:   [address] [place]  [address] [phone]       Follow up with Pichardo-Geisinger, Robby Sermon, MD On 07/05/2013. (At 1pm.)    Contact information:   [address] [place] [address] [phone]       Discharge Instructions: Discharge Orders   Future Appointments Provider Department Dept Phone   06/29/2013 9:00 AM Na Dierdre Searles, MD [facility] [phone]   Future Orders Complete By Expires   Diet - low sodium heart healthy  As directed    Discharge instructions  As directed    Comments:     We will be prescribing a course of prednisone for you. Please take 40mg  for 5days, 20mg  for 3 days, and 10mg  for 2 days.  Please it is very important you keep your appointment with the dermatologist which is the skin doctor. As it is very difficult to get an appointment without insurance. ALso remember the $88 partial payment that you most likely will have to pay. You will meet with there fiancial counsellors to help.   Increase activity slowly  As directed      Procedures Performed:  Dg Chest 2 View  06/19/2013   CLINICAL DATA:  Smoker, rule out lung mass  EXAM: CHEST  2 VIEW  COMPARISON:  None.  FINDINGS: Normal cardiac silhouette with ectatic aorta. There are linear markings at the left and right lung base. There is irregular calcifications seen on  the lateral projection inferior to the hila. No focal consolidation. No mass seen.  IMPRESSION: 1. Linear markings at lung bases could represent edema or bronchitis. 2. Calcifications inferior to the hilum on a lateral projection could be pleural or mediastinal. Consider CT thorax for further evaluation.   Electronically Signed   By: Genevive Bi M.D.   On: 06/19/2013 18:20   Dg Ankle Complete Right  06/18/2013   CLINICAL DATA:  Leg swelling  EXAM: RIGHT ANKLE - COMPLETE 3+ VIEW  COMPARISON:  None.  FINDINGS: There is prominent subcutaneous soft tissue swelling at the level of the distal half of the tibia and fibula and marked diffuse  swelling about the ankle. The bones appear osteopenic. No fracture or focal bony abnormality.  IMPRESSION: Diffuse subcutaneous edema swelling of the visualized lower extremity. No acute bony abnormality.   Electronically Signed   By: Britta Mccreedy M.D.   On: 06/18/2013 13:16   US Venous Img Lower Unilateral Right  06/01/2013   CLINICAL DATA:  RIGHT LOWER EXTREMITY ERYTHEMA, SWELLING AND PAIN, RECENT HISTORY OF SPIDER BITES, HISTORY IS SMOKING, EVALUATE FOR DVT  EXAM: RIGHT LOWER EXTREMITY VENOUS ULTRASOUND  TECHNIQUE: Gray-scale sonography with graded compression, as well as color Doppler and duplex ultrasound, were performed to evaluate the deep venous system from the level of the common femoral vein through the popliteal and proximal calf veins. Spectral Doppler was utilized to evaluate flow at rest and with distal augmentation maneuvers.  COMPARISON:  None.  FINDINGS: Thrombus within deep veins:  None visualized.  Compressibility of deep veins:  Normal.  Duplex waveform respiratory phasicity:  Normal.  Duplex waveform response to augmentation:  Normal.  Venous reflux:  None visualized.  Other findings: There is a minimal amount of subcutaneous edema at the level of the ankle (images 18 and 23).  There are two adjacent enlarged lymph node within the medial aspect of the right upper thigh which measures approximately 1.3 cm in greatest short axis diameter (image 30) as well as an adjacent lymph node which measures approximate 1.3 cm (image 32).  IMPRESSION: 1. No evidence of DVT within the right lower extremity. 2. Adenopathy within the medial aspect of the right upper thigh, possibly reactive given history of recent spider bites, however malignancy is not excluded on the basis of this examination. Repeat inguinal soft tissue ultrasound may be performed after the resolution of acute symptoms as clinically indicated. This was made a call report.   Electronically Signed   By: Simonne Come M.D.   On: 06/01/2013  12:48   Dg Foot Complete Right  06/18/2013   CLINICAL DATA:  Leg swelling.  Stone back in 6 2 weeks ago.  EXAM: RIGHT FOOT COMPLETE - 3+ VIEW  COMPARISON:  Ankle radiographs 06/18/2013  FINDINGS: The bones are diffusely osteopenic. The joints are located. No acute or healing fracture is identified. There is diffuse soft tissue swelling, particularly along the dorsum of the foot.  IMPRESSION: Diffuse soft tissue swelling.  No acute bony abnormality identified.   Electronically Signed   By: Britta Mccreedy M.D.   On: 06/18/2013 13:17   Admission HPI:  Kelly Callahan is a 57 year old woman with no significant past medical history who presents with worsening redness and swelling of RLE x 3 weeks.  Patient states she was bitten by an insect on dorsal right foot approximately one month ago. 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 She went to slip her  foot in the sandal when she felt something sting or bite her. She did not see the insect but thinks it was a spider "because that's the only thing that hides in your shoe and bites you." The week of the bite, she did not have much swelling or redness, but after a week or so had passed, she started experiencing redness and swelling of her foot. She sought medical attention at [facility] where she was started on Levaquin and prednisone for presumed cellulitis. However, she developed hives soon after so was switched to Keflex. She has reportedly been taking Keflex with good compliance since 11/13 and completed 5 day course of prednisone; she has also applied hydrocortisone cream to area with no improvement. Patient does report that the dorsal surface of her right foot has been oozing clear drainage but denies fever or systemic symptoms. She "doesn't notice" her foot if the leg is elevated but painful with walking. She has had some neck, ear, and periorbital swelling as well which she states are improving. Denies lip, tongue, throat swelling or  difficulty breathing. No recent new skin products; does not spend time outdoors and does not have pets. She does not have seasonal or food allergies though she does have medication allergy to penicillin; she may have had eczema years ago. Patient would not have come to the ED today if her brother had not forced her to.  She smokes 1/2 PPD (requesting patch), no EtOH or illicits. She lives with her daughter who has not had any similar symptoms.   Physical exam-  Blood pressure 126/70, pulse 86, temperature 98.3 F (36.8 C), temperature source Oral, resp. rate 16, weight 170 lb 4.8 oz (77.248 kg), SpO2 100.00%.  General: alert, cooperative, and in no apparent distress HEENT: NCAT, vision grossly intact, oropharynx clear and non-erythematous; atopic appearance of periocular area and neck; no conjunctival hyperemia or chemosis, no decreased ocular motility, no pain with eye movements, no decreased visual acuity Neck: supple, no lymphadenopathy Lungs: clear to ascultation bilaterally, normal work of respiration, no wheezes, rales, ronchi Heart: regular rate and rhythm, no murmurs, gallops, or rubs Abdomen: soft, non-tender, non-distended, normal bowel sounds  Extremities: right lower extremity erythematous and edematous up to ~2 inches inferior to knee with marked edema of lateral ankle, TTP of dorsal foot, skin flaky with some fissuring; right ankle ROM intact; 2+ DP/PT pulses bilaterally, no cyanosis, clubbing Neurologic: alert & oriented X3, cranial nerves II-XII intact, strength grossly intact, sensation intact to light touch  Hospital Course by problem list-    # RLE swelling and pain, after hx of a bite- ? bug-was not seen by pt, pt presented after 3 week. Exact etiology was unclear despite workup during admission and at discharge. Considerations- Cellultis Vs DVT vs envenomation. Pt had already been treated with keflex, without improvement and 1 dose of Vanc here, but low clinical suspicion for  infection so Vanc was discont, also pt was afebrile,withno leukocytosis,0/4 SIRs criteria. Venous dopplers were negative for DVT, superficial thrombosis or Bakers Cyst. Pt was placed on hydroxyzine- 25mg  QID for itching, and Norco- 5-325 for pain-Q6H, On discharge, pt reported minimal improvement in swelling and appearance of RLE.  # Swelling And Facial Rash- Unsure exactly if this is related to the levaquin which she reportedly took after which meds was switched to keflex, after she said she had a rash,or related to the leg swelling and dryness of her feet. Possible etiologies- Chronic Atopic dermatitis vs dermatomyositis vs Contact  dermatitis. Dermatomyositis- Pt had Xtic shawl sign pattern ant and post chest,  hyperpig lesions on palms, and few plaque like appearing lesions on the dorsal surface of her fingers- Possibly only the skin manifestations of dermatomyositis. ANA- Normal, LDH- WNL, Aldolase- pending, CK- normal, HIV- Neg. A dermatologist could not be gotten to see pt while on admission. Pt had a skin Biopsy done 06/20/2013, results available after discharge- subacute spongiotic dermatitis, with some feats of an allergic contact dermatitis. Pt reported that the rash appeared better after getting 5 days of prednisone previously. Pt was discharged on Prednisone, 40mg  daily for 5 days and taper- 20mg  daily for 3 days and 10mg  daily for 2 days, to follow up in our clinic. Also set up a dermatology appointment for pt at [facility] Dec 17th at 1pm. I stressed the importance of keeping this appoitment to the pt, as she has no insurance which makes getting appointments a challenge. Pertinent documents faxed to [facility].  #Tobacco abuse- Patient smokes 1/2 PPD. Nicotine patch on admission, Smoking cessation counseling.   Discharge Vitals:   BP 127/70  Pulse 84  Temp(Src) 99.1 F (37.3 C) (Oral)  Resp 18  Wt 170 lb 4.8 oz (77.248 kg)  SpO2 98%  Signed: Kennis Carina,  MD 06/21/2013, 4:02 PM   Time Spent on Discharge: 35 minutes Services Ordered on Discharge: None Equipment Ordered on Discharge: None.

## 2013-06-21 NOTE — Progress Notes (Signed)
Lesions on patients bilateral arms, neck, and ear weeping clear yellowish fluid. Throughout the night patient's face gradually began to swell and weep. Patient is in no distress. Tongue is not swollen and there is no difficulty breathing, or difficulty talking per patient. Patient convey that in the past she has been admitted to [facility] for similar lesions with weeping that was on her arms and legs and at the time was given Prednisone which helped. Will continue to monitor patient.

## 2013-06-21 NOTE — Progress Notes (Signed)
RN reviewed discharge instructions with patient and family. All questions answered. Informed about prescriptions being called in. RN wheeled down in wheel chair with NT to family car.

## 2013-06-24 ENCOUNTER — Encounter: Payer: Self-pay | Admitting: Internal Medicine

## 2013-06-24 NOTE — Progress Notes (Signed)
R lower leg

## 2013-06-24 NOTE — Progress Notes (Signed)
Anterior chest

## 2013-06-24 NOTE — Progress Notes (Signed)
Back

## 2013-06-24 NOTE — Progress Notes (Signed)
The following are pics when Kelly Callahan was admitted. We got her permission to take the photos. We were not able to get the EPIC photo app to work. All the photos got flipped when inserting into EPIC.

## 2013-06-24 NOTE — Progress Notes (Signed)
R foot

## 2013-06-24 NOTE — Progress Notes (Signed)
R post axillary region

## 2013-06-29 ENCOUNTER — Ambulatory Visit (INDEPENDENT_AMBULATORY_CARE_PROVIDER_SITE_OTHER): Payer: Self-pay | Admitting: Internal Medicine

## 2013-06-29 ENCOUNTER — Encounter: Payer: Self-pay | Admitting: Internal Medicine

## 2013-06-29 VITALS — BP 150/89 | HR 65 | Temp 97.5°F | Ht 64.0 in | Wt 165.9 lb

## 2013-06-29 DIAGNOSIS — L308 Other specified dermatitis: Secondary | ICD-10-CM | POA: Insufficient documentation

## 2013-06-29 DIAGNOSIS — I10 Essential (primary) hypertension: Secondary | ICD-10-CM

## 2013-06-29 DIAGNOSIS — L239 Allergic contact dermatitis, unspecified cause: Secondary | ICD-10-CM

## 2013-06-29 DIAGNOSIS — J984 Other disorders of lung: Secondary | ICD-10-CM

## 2013-06-29 DIAGNOSIS — R599 Enlarged lymph nodes, unspecified: Secondary | ICD-10-CM

## 2013-06-29 DIAGNOSIS — L259 Unspecified contact dermatitis, unspecified cause: Secondary | ICD-10-CM

## 2013-06-29 DIAGNOSIS — R590 Localized enlarged lymph nodes: Secondary | ICD-10-CM

## 2013-06-29 MED ORDER — DIPHENHYDRAMINE-ZINC ACETATE 1-0.1 % EX CREA: TOPICAL_CREAM | Freq: Three times a day (TID) | CUTANEOUS | Status: DC | PRN

## 2013-06-29 NOTE — Progress Notes (Signed)
   Patient: Kelly Callahan   MRN: [id number]  DOB: 1956/04/11  PCP: Pcp Not In System   Subjective:    CC: Follow-up   HPI: Kelly Callahan is a 57 y.o. female with a PMHx of GERD and HTN(not on medication), who presented to clinic today for the hospital follow up.  Patient was hospitalized on 06/18/13. for RLE, facial and neck swelling and rash after a possible bug bite.  She had negative DVT workup, and Her lab work including ANA, LDH, Aldolase,CK, HIV are are negative. Skin biopsy was done on 06/20/2013 and showed subacute spongiotic dermatitis with some features of allergic contact dermatitis.  Patient was initially treated with antibiotics Keflex which was switched to Levaquin then stopped.  She is discharged on prednisone tapering dose, and a dermatology appointment for patient will set up at [facility] at 1 pm on 07/05/13.  Patient states that she is feeling better since hospital discharge. Stating that her leg and facial swelling has improved significantly. Reports minimal improvement in neck swelling. She reports medical compliance.  She is here for follow up.   Review of Systems: Per HPI.   Current Outpatient Medications: Current Outpatient Prescriptions  Medication Sig Dispense Refill  . hydrOXYzine (ATARAX/VISTARIL) 25 MG tablet Take 25 mg by mouth 4 (four) times daily as needed for itching.      Marland Kitchen ibuprofen (ADVIL,MOTRIN) 200 MG tablet Take 400 mg by mouth every 6 (six) hours as needed for moderate pain.      . predniSONE (DELTASONE) 20 MG tablet Take 2 tablets (40 mg total) by mouth daily with breakfast.  14 tablet  0  . diphenhydrAMINE-zinc acetate (BENADRYL) cream Apply topically 3 (three) times daily as needed for itching.  28.3 g  0   No current facility-administered medications for this visit.    Allergies: Allergies  Allergen Reactions  . Levaquin [Levofloxacin In D5w] Hives  . Levaquin [Levofloxacin]   . Penicillins     rash    Past  Medical History  Diagnosis Date  . Cellulitis 05/2013    RT LEG SPIDER BITE  . GERD (gastroesophageal reflux disease)   . Hypertension     PATIENT STATES SHE DOES NOT FEEL SHE HAS HTN     Objective:    Physical Exam: Filed Vitals:   06/29/13 0915  BP: 150/89  Pulse: 65  Temp: 97.5 F (36.4 C)  TempSrc: Oral  Height: 5\' 4"  (1.626 m)  Weight: 165 lb 14.4 oz (75.252 kg)  SpO2: 100%    General: Vital signs reviewed and noted. Well-developed, well-nourished, in no acute distress; alert, appropriate and cooperative throughout examination.  skin Atopic appearance of periocular area, neck and right LE.  Skin hyperpigmentation and dryness noted on neck and RLE. Mild swelling noted on anterior neck and RLE area. No Erythema, tenderness or warmth to touch. ? Thyromegaly vs neck swelling  Lungs:  Normal respiratory effort. Clear to auscultation BL without crackles or wheezes.  Heart: RRR. S1 and S2 normal without gallop, rubs, murmur.  Abdomen:  BS normoactive. Soft, Nondistended, non-tender.  No masses or organomegaly.  Extremities: No pretibial edema.   Assessment/ Plan:

## 2013-06-29 NOTE — Patient Instructions (Addendum)
1. will stop your benadryl pills 2. Will give you Benadryl Cream every 4-6 hours as needed for itching, and you can apply it to you neck area. 3. You can take hydroxyzine tabs 25 mg every 4-6 hours as needed ( you have a Rx by hospital doctor) 4. Avoid taking Benadryl and Hydroxyzine if you are driving.  6. Please follow up with your dermatologist as scheduled. You have an appt at 1pm on 07/05/13.  5. Please follow up with your PCP in one week for neck and right leg swelling 7. Please see your PCP at [place] or go to ED if you have worsening of itching and swelling. Or you develop fever , chills, unable to breath.

## 2013-07-03 DIAGNOSIS — R590 Localized enlarged lymph nodes: Secondary | ICD-10-CM | POA: Insufficient documentation

## 2013-07-03 DIAGNOSIS — J984 Other disorders of lung: Secondary | ICD-10-CM | POA: Insufficient documentation

## 2013-07-03 NOTE — Assessment & Plan Note (Signed)
Assessment: Patient with a PMH of GERD and hypertension is here for hospital of followup after treatment of facial, neck and RLE swelling or rash of a possible bug bite.  Skin biopsy showed subacute spongiotic dermatitis with some features of allergic contact dermatitis. She feel better and is compliance with medicine tapering dose.   Plan: - Continue Hydroxyzine 25 mg po QID PRN for itching. - Will stop Benadryl pill and change to Benadryl cream TID as needed - Patient is instructed to avoid driving when taking above 2 medications. - Will recs to TSH and neck U/S given her neck swelling vs ? Thyromegaly.  However, patient has a primary care physician at [facility], and she does not want our clinic to be her PCP.  She states that she would only request our clinic to submit work comp for her, but she prefers not to followup with our clinic.  - I discussed with her that we will send the records to her PCP.

## 2013-07-03 NOTE — Assessment & Plan Note (Signed)
Assessment: This is an incidental finding from lower extremity Doppler study. A detailed study report is as following:  IMPRESSION:  1. No evidence of DVT within the right lower extremity.  2. Adenopathy within the medial aspect of the right upper thigh,  possibly reactive given history of recent spider bites, however  malignancy is not excluded on the basis of this examination. Repeat  inguinal soft tissue ultrasound may be performed after the  resolution of acute symptoms as clinically indicated.  This was made a call report.  Plan: Patient will need followup with her PCP for repeat inguinal soft tissue ultrasound once all her acute symptoms are resolved. Was sent to office note to her PCP.

## 2013-07-03 NOTE — Assessment & Plan Note (Signed)
Assessment: This was an incidental finding from patient's chest x-ray during the hospitalization 06/18/2013.  The CXR report is as following:  CXR IMPRESSION:  1. Linear markings at lung bases could represent edema or  bronchitis.  2. Calcifications inferior to the hilum on a lateral projection  could be pleural or mediastinal. Consider CT thorax for further  Evaluation.  Plan: - She will need the further evaluation and management with her PCP.  - will send the OV note to her PCP office.

## 2013-07-04 NOTE — Progress Notes (Signed)
Case discussed with Dr. Li soon after the resident saw the patient. We reviewed the resident's history and exam and pertinent patient test results. I agree with the assessment, diagnosis, and plan of care documented in the resident's note. 

## 2013-07-07 ENCOUNTER — Encounter: Payer: Self-pay | Admitting: Internal Medicine

## 2013-07-07 ENCOUNTER — Ambulatory Visit (INDEPENDENT_AMBULATORY_CARE_PROVIDER_SITE_OTHER): Payer: Self-pay | Admitting: Internal Medicine

## 2013-07-07 VITALS — BP 135/83 | HR 86 | Temp 98.8°F | Ht 64.0 in | Wt 163.5 lb

## 2013-07-07 DIAGNOSIS — L259 Unspecified contact dermatitis, unspecified cause: Secondary | ICD-10-CM

## 2013-07-07 DIAGNOSIS — L308 Other specified dermatitis: Secondary | ICD-10-CM

## 2013-07-07 DIAGNOSIS — J984 Other disorders of lung: Secondary | ICD-10-CM

## 2013-07-07 LAB — TSH: TSH: 0.356 u[IU]/mL (ref 0.350–4.500)

## 2013-07-07 NOTE — Progress Notes (Signed)
   Patient: Kelly Callahan   MRN: [id number]  DOB: 1955/07/26  PCP: Pcp Not In System   Subjective:    CC: F/U Visit   HPI: Ms. Kelly Callahan is a 57 y.o. female with a PMHx GERD and HTN(not on medication), who presented to clinic today for the follow up since last OV..   06/29/13 office note.  Patient was hospitalized on 06/18/13. for RLE, facial and neck swelling and rash after a possible bug bite. She had negative DVT workup, and Her lab work including ANA, LDH, Aldolase,CK, HIV are are negative. Skin biopsy was done on 06/20/2013 and showed subacute spongiotic dermatitis with some features of allergic contact dermatitis. Patient was initially treated with antibiotics Keflex which was switched to Levaquin then stopped. She is discharged on prednisone tapering dose, and a dermatology appointment for patient will set up at [facility] at 1 pm on 07/05/13.   Patient states that she was evaluated [facility]. Barkley Boards and brought the instructions with her. She was asked to stop benadryl cream, start Triamcinolone ointment BID/moisturizing  with Vaseline/atarax 1 at HS as needed.   She states that she feels better since she started above treatment. She states that her face and neck swelling is completely resolved. Right LE swelling is better but she has mild soreness on right LE. Marland Kitchen Denies fever, chills, drainage or ulcer.    Review of Systems: Per HPI.   Current Outpatient Medications: Current Outpatient Prescriptions  Medication Sig Dispense Refill  . diphenhydrAMINE-zinc acetate (BENADRYL) cream Apply topically 3 (three) times daily as needed for itching.  28.3 g  0  . hydrOXYzine (ATARAX/VISTARIL) 25 MG tablet Take 25 mg by mouth 4 (four) times daily as needed for itching.      Marland Kitchen ibuprofen (ADVIL,MOTRIN) 200 MG tablet Take 400 mg by mouth every 6 (six) hours as needed for moderate pain.      . predniSONE (DELTASONE) 20 MG tablet Take 2 tablets (40 mg  total) by mouth daily with breakfast.  14 tablet  0   No current facility-administered medications for this visit.    Allergies: Allergies  Allergen Reactions  . Levaquin [Levofloxacin In D5w] Hives  . Levaquin [Levofloxacin]   . Penicillins     rash    Past Medical History  Diagnosis Date  . Cellulitis 05/2013    RT LEG SPIDER BITE  . GERD (gastroesophageal reflux disease)   . Hypertension     PATIENT STATES SHE DOES NOT FEEL SHE HAS HTN     Objective:    Physical Exam: Filed Vitals:   07/07/13 1049  BP: 135/83  Pulse: 86  Temp: 98.8 F (37.1 C)  TempSrc: Oral  Height: 5\' 4"  (1.626 m)  Weight: 163 lb 8 oz (74.163 kg)  SpO2: 100%    General:  Vital signs reviewed and noted. Well-developed, well-nourished, in no acute distress; alert, appropriate and cooperative throughout examination.  Neck: ? Thyromegaly  skin  Atopic appearance of right LE. Skin hyperpigmentation and dryness noted on neck and RLE. Mild swelling noted on RLE area and better than before. No Erythema, tenderness or warmth to touch.  Lungs:  Normal respiratory effort. Clear to auscultation BL without crackles or wheezes.  Heart:  RRR. S1 and S2 normal without gallop, rubs, murmur.  Abdomen:  BS normoactive. Soft, Nondistended, non-tender. No masses or organomegaly.  Extremities:  No pretibial edema.   Assessment/ Plan:

## 2013-07-07 NOTE — Assessment & Plan Note (Addendum)
Assessment: This is an incidental finding from her x-ray during hospitalization 06/18/2013.  Patient denies these symptoms. Lungs examination are unremarkable. Pertinent history include   Plan - Will discuss with radiology about appropriate chest CTorder - patient does not have insurance but is willing to have this test done now.

## 2013-07-07 NOTE — Assessment & Plan Note (Addendum)
Assessment: Patient is here for followup since last office visit on 07/03/2013.  She continues to feel better and states that her face/neck swelling is completely resolved.  Right LE. swelling has improved.  She still has mild RLE soreness.  Physical examination reveals RLE mild swelling.  No evidence of infection or ulcer. She is using triamcinolone cream prescribed by her Dermatologist.  Plan -  Continue the current therapy by her Dermatologist -  Patient is instructed to followup with her dermatologist as scheduled.  The next appointment is in 4 weeks. -  Patient is to call the clinic if she experiences fever, chills, RLE redness/warmth to touch/worsening of the swelling/ulcer/drainage. - Patient understands and agrees with above plan

## 2013-07-07 NOTE — Patient Instructions (Signed)
1.  stop your benadryl creama.  2. continue hydroxyzine tabs 25 mg at bedtime as needed per Dr. Alycia Rossetti instruction.  3. Avoid taking Hydroxyzine if you are driving.  4. Please follow up with your dermatologist as scheduled. 5. Please contact your PCP office to fax over your medical records.  6. My nurse will call and set up the CT for you.

## 2013-07-08 ENCOUNTER — Encounter: Payer: Self-pay | Admitting: Internal Medicine

## 2013-07-11 NOTE — Progress Notes (Signed)
Case discussed with Dr. Li at time of visit.  We reviewed the resident's history and exam and pertinent patient test results.  I agree with the assessment, diagnosis, and plan of care documented in the resident's note. 

## 2013-07-17 ENCOUNTER — Encounter: Payer: Self-pay | Admitting: Internal Medicine

## 2013-07-17 ENCOUNTER — Other Ambulatory Visit: Payer: Self-pay | Admitting: Internal Medicine

## 2013-07-17 DIAGNOSIS — J984 Other disorders of lung: Secondary | ICD-10-CM

## 2013-07-19 ENCOUNTER — Telehealth: Payer: Self-pay | Admitting: Internal Medicine

## 2013-07-19 ENCOUNTER — Ambulatory Visit (HOSPITAL_COMMUNITY)
Admission: RE | Admit: 2013-07-19 | Discharge: 2013-07-19 | Disposition: A | Discharge status: Home / Self Care | Payer: No Typology Code available for payment source | Source: Ambulatory Visit | Attending: Internal Medicine | Admitting: Internal Medicine

## 2013-07-19 ENCOUNTER — Encounter: Payer: Self-pay | Admitting: Internal Medicine

## 2013-07-19 DIAGNOSIS — K8020 Calculus of gallbladder without cholecystitis without obstruction: Secondary | ICD-10-CM | POA: Insufficient documentation

## 2013-07-19 DIAGNOSIS — R9389 Abnormal findings on diagnostic imaging of other specified body structures: Secondary | ICD-10-CM | POA: Insufficient documentation

## 2013-07-19 DIAGNOSIS — J984 Other disorders of lung: Secondary | ICD-10-CM

## 2013-07-19 MED ORDER — IOHEXOL 300 MG/ML  SOLN
80.0000 mL | Freq: Once | INTRAMUSCULAR | Status: AC | PRN
  Administered 2013-07-19: 80 mL via INTRAVENOUS

## 2013-07-19 NOTE — Telephone Encounter (Signed)
  INTERNAL MEDICINE RESIDENCY PROGRAM After-Hours Telephone Call    Reason for call:   I placed an outgoing call to Ms. Laprecious Ostrosky at 230 pm regarding. I discussed with her about her chest CT results and no indication of calcification noted on chest CT. The result is as following:  Chest CT 1. No acute abnormality in the thorax.  2. No calcifications in the mediastinal or hilar regions to account  for the perceived abnormality on the recent chest radiograph. The  findings on the chest x-ray were presumably related to overlap of  normal anatomical structures.  3. Cholelithiasis without evidence to suggest acute cholecystitis at  this time.     Assessment/ Plan:   All questions answered. Patient should follow up with the clinic as scheduled.   As always, pt is advised that if symptoms worsen or new symptoms arise, they should go to an urgent care facility or to to ER for further evaluation.    Dede Query, MD   07/19/2013, 2:29 PM

## 2013-08-02 ENCOUNTER — Ambulatory Visit: Payer: Self-pay

## 2013-10-21 ENCOUNTER — Emergency Department (HOSPITAL_BASED_OUTPATIENT_CLINIC_OR_DEPARTMENT_OTHER): Payer: No Typology Code available for payment source

## 2013-10-21 ENCOUNTER — Emergency Department (HOSPITAL_BASED_OUTPATIENT_CLINIC_OR_DEPARTMENT_OTHER)
Admission: EM | Admit: 2013-10-21 | Discharge: 2013-10-21 | Disposition: A | Discharge status: Home / Self Care | Payer: No Typology Code available for payment source | Attending: Emergency Medicine | Admitting: Emergency Medicine

## 2013-10-21 ENCOUNTER — Encounter (HOSPITAL_BASED_OUTPATIENT_CLINIC_OR_DEPARTMENT_OTHER): Payer: Self-pay | Admitting: Emergency Medicine

## 2013-10-21 DIAGNOSIS — K859 Acute pancreatitis without necrosis or infection, unspecified: Secondary | ICD-10-CM | POA: Insufficient documentation

## 2013-10-21 DIAGNOSIS — F17200 Nicotine dependence, unspecified, uncomplicated: Secondary | ICD-10-CM | POA: Insufficient documentation

## 2013-10-21 DIAGNOSIS — Z79899 Other long term (current) drug therapy: Secondary | ICD-10-CM | POA: Insufficient documentation

## 2013-10-21 DIAGNOSIS — Z880 Allergy status to penicillin: Secondary | ICD-10-CM | POA: Insufficient documentation

## 2013-10-21 DIAGNOSIS — K219 Gastro-esophageal reflux disease without esophagitis: Secondary | ICD-10-CM | POA: Insufficient documentation

## 2013-10-21 DIAGNOSIS — I10 Essential (primary) hypertension: Secondary | ICD-10-CM | POA: Insufficient documentation

## 2013-10-21 DIAGNOSIS — Z872 Personal history of diseases of the skin and subcutaneous tissue: Secondary | ICD-10-CM | POA: Insufficient documentation

## 2013-10-21 DIAGNOSIS — IMO0002 Reserved for concepts with insufficient information to code with codable children: Secondary | ICD-10-CM | POA: Insufficient documentation

## 2013-10-21 HISTORY — DX: Acute pancreatitis without necrosis or infection, unspecified: K85.90

## 2013-10-21 LAB — URINALYSIS, ROUTINE W REFLEX MICROSCOPIC
Bilirubin Urine: NEGATIVE
GLUCOSE, UA: NEGATIVE mg/dL
Hgb urine dipstick: NEGATIVE
Ketones, ur: NEGATIVE mg/dL
LEUKOCYTES UA: NEGATIVE
Nitrite: NEGATIVE
PH: 6 (ref 5.0–8.0)
PROTEIN: NEGATIVE mg/dL
Specific Gravity, Urine: 1.012 (ref 1.005–1.030)
Urobilinogen, UA: 0.2 mg/dL (ref 0.0–1.0)

## 2013-10-21 LAB — CBC WITH DIFFERENTIAL/PLATELET
BASOS ABS: 0 10*3/uL (ref 0.0–0.1)
BASOS PCT: 0 % (ref 0–1)
Eosinophils Absolute: 0.1 10*3/uL (ref 0.0–0.7)
Eosinophils Relative: 2 % (ref 0–5)
HEMATOCRIT: 37.2 % (ref 36.0–46.0)
HEMOGLOBIN: 13 g/dL (ref 12.0–15.0)
Lymphocytes Relative: 41 % (ref 12–46)
Lymphs Abs: 2 10*3/uL (ref 0.7–4.0)
MCH: 33.3 pg (ref 26.0–34.0)
MCHC: 34.9 g/dL (ref 30.0–36.0)
MCV: 95.4 fL (ref 78.0–100.0)
Monocytes Absolute: 0.5 10*3/uL (ref 0.1–1.0)
Monocytes Relative: 11 % (ref 3–12)
NEUTROS ABS: 2.3 10*3/uL (ref 1.7–7.7)
NEUTROS PCT: 46 % (ref 43–77)
Platelets: 184 10*3/uL (ref 150–400)
RBC: 3.9 MIL/uL (ref 3.87–5.11)
RDW: 13.7 % (ref 11.5–15.5)
WBC: 5 10*3/uL (ref 4.0–10.5)

## 2013-10-21 LAB — COMPREHENSIVE METABOLIC PANEL
ALBUMIN: 4.1 g/dL (ref 3.5–5.2)
ALK PHOS: 57 U/L (ref 39–117)
ALT: 9 U/L (ref 0–35)
AST: 15 U/L (ref 0–37)
BILIRUBIN TOTAL: 0.3 mg/dL (ref 0.3–1.2)
BUN: 14 mg/dL (ref 6–23)
CHLORIDE: 105 meq/L (ref 96–112)
CO2: 27 mEq/L (ref 19–32)
Calcium: 10.1 mg/dL (ref 8.4–10.5)
Creatinine, Ser: 1 mg/dL (ref 0.50–1.10)
GFR calc Af Amer: 71 mL/min — ABNORMAL LOW (ref >90)
GFR, EST NON AFRICAN AMERICAN: 61 mL/min — AB (ref >90)
Glucose, Bld: 92 mg/dL (ref 70–99)
POTASSIUM: 3.9 meq/L (ref 3.7–5.3)
SODIUM: 143 meq/L (ref 137–147)
Total Protein: 7.6 g/dL (ref 6.0–8.3)

## 2013-10-21 LAB — LIPASE, BLOOD: Lipase: 172 U/L — ABNORMAL HIGH (ref 11–59)

## 2013-10-21 MED ORDER — GI COCKTAIL ~~LOC~~
30.0000 mL | Freq: Once | ORAL | Status: AC
  Administered 2013-10-21: 30 mL via ORAL
  Filled 2013-10-21: qty 30

## 2013-10-21 MED ORDER — OXYCODONE-ACETAMINOPHEN 5-325 MG PO TABS
2.0000 | ORAL_TABLET | ORAL | Status: DC | PRN
Start: 2013-10-21 — End: 2013-11-08

## 2013-10-21 NOTE — ED Provider Notes (Signed)
CSN: [id number]     Arrival date & time 10/21/13  1317 History   First MD Initiated Contact with Patient 10/21/13 1358     Chief Complaint  Patient presents with  . Abdominal Pain     (Consider location/radiation/quality/duration/timing/severity/associated sxs/prior Treatment) Patient is a 58 y.o. female presenting with abdominal pain. The history is provided by the patient. No language interpreter was used.  Abdominal Pain Pain location:  Generalized Pain quality: aching   Pain radiates to:  Does not radiate Pain severity:  Moderate Onset quality:  Gradual Duration:  2 weeks Timing:  Constant Progression:  Worsening Chronicity:  New Relieved by:  Nothing Worsened by:  Nothing tried Ineffective treatments:  None tried Associated symptoms: anorexia, belching and diarrhea     Past Medical History  Diagnosis Date  . Cellulitis 05/2013    RT LEG SPIDER BITE  . GERD (gastroesophageal reflux disease)   . Hypertension     PATIENT STATES SHE DOES NOT FEEL SHE HAS HTN   . Pancreatitis    Past Surgical History  Procedure Laterality Date  . Cesarean section     No family history on file. History  Substance Use Topics  . Smoking status: Current Every Day Smoker -- 0.40 packs/day for 30 years    Types: Cigarettes  . Smokeless tobacco: Never Used     Comment: Trying to cut back.  . Alcohol Use: No   OB History   Grav Para Term Preterm Abortions TAB SAB Ect Mult Living                 Review of Systems  Gastrointestinal: Positive for abdominal pain, diarrhea and anorexia.  All other systems reviewed and are negative.      Allergies  Levaquin; Levaquin; and Penicillins  Home Medications   Current Outpatient Rx  Name  Route  Sig  Dispense  Refill  . lisinopril (PRINIVIL,ZESTRIL) 20 MG tablet   Oral   Take 20 mg by mouth daily.         Marland Kitchen omeprazole (PRILOSEC) 40 MG capsule   Oral   Take 40 mg by mouth daily.         . hydrOXYzine (ATARAX/VISTARIL) 25 MG  tablet   Oral   Take 25 mg by mouth at bedtime as needed for itching.          . triamcinolone cream (KENALOG) 0.5 %   Topical   Apply 1 application topically 2 (two) times daily.          BP 158/96  Pulse 92  Temp(Src) 98.4 F (36.9 C) (Oral)  Resp 18  Ht 5\' 5"  (1.651 m)  Wt 160 lb (72.576 kg)  BMI 26.63 kg/m2  SpO2 98% Physical Exam  Nursing note and vitals reviewed. Constitutional: She is oriented to person, place, and time. She appears well-developed and well-nourished.  HENT:  Head: Normocephalic.  Right Ear: External ear normal.  Nose: Nose normal.  Mouth/Throat: Oropharynx is clear and moist.  Eyes: Conjunctivae and EOM are normal. Pupils are equal, round, and reactive to light.  Neck: Normal range of motion.  Cardiovascular: Normal rate.   Pulmonary/Chest: Effort normal.  Abdominal: Soft. She exhibits no distension. There is tenderness.  Musculoskeletal: Normal range of motion.  Neurological: She is alert and oriented to person, place, and time.  Skin: Skin is warm.  Psychiatric: She has a normal mood and affect.    ED Course  Procedures (including critical care time) Labs Review Labs Reviewed  COMPREHENSIVE METABOLIC PANEL - Abnormal; Notable for the following:    GFR calc non Af Amer 61 (*)    GFR calc Af Amer 71 (*)    All other components within normal limits  LIPASE, BLOOD - Abnormal; Notable for the following:    Lipase 172 (*)    All other components within normal limits  URINALYSIS, ROUTINE W REFLEX MICROSCOPIC  CBC WITH DIFFERENTIAL   Imaging Review [place] Abdomen Complete  10/21/2013   CLINICAL DATA:  Epigastric pain  EXAM: ULTRASOUND ABDOMEN COMPLETE  COMPARISON:  07/28/2010  FINDINGS: Gallbladder:  Cholelithiasis is identified.  No wall thickening is noted.  Common bile duct:  Diameter: 2.7 mm.  Liver:  No focal lesion identified. Within normal limits in parenchymal echogenicity.  IVC:  No abnormality visualized.  Pancreas:  The head of the  pancreas is again prominent similar to that seen on the prior exam. Correlation with laboratory values is recommended.  Spleen:  Size and appearance within normal limits.  Right Kidney:  Length: 10.1 cm. Echogenicity within normal limits. No mass or hydronephrosis visualized.  Left Kidney:  Length: 10.4 cm. Echogenicity within normal limits. No mass or hydronephrosis visualized.  Abdominal aorta:  Prominence of the proximal abdominal aorta is noted although normal tapering is noted distally.  Other findings:  None.  IMPRESSION: Cholelithiasis.  Prominence of the head of the pancreas stable from the previous exam of 2012. This may be related to the patient's known history of previous pancreatitis. . Correlation with laboratory values is recommended.   Electronically Signed   By: Alcide CleverMark  Lukens M.D.   On: 10/21/2013 15:16     EKG Interpretation   Date/Time:  Saturday October 21 2013 13:37:56 EDT Ventricular Rate:  77 PR Interval:  164 QRS Duration: 92 QT Interval:  382 QTC Calculation: 432 R Axis:   36 Text Interpretation:  Normal sinus rhythm Minimal voltage criteria for  LVH, may be normal variant No previous tracing Confirmed by Anitra LauthPLUNKETT  MD,  Alphonzo LemmingsWHITNEY ([id number]) on 10/21/2013 1:52:56 PM      MDM Pt reports some relief with medications   Final diagnoses:  Pancreatitis    Pt counseled on results I advised continue current medication.   Clear luquids with progression of diet over 48 hours.   See Gi doctor for evaluation    Elson AreasLeslie K Chrsitopher Wik, PA-C 10/21/13 1647

## 2013-10-21 NOTE — Discharge Instructions (Signed)
Acute Pancreatitis °Acute pancreatitis is a disease in which the pancreas becomes suddenly inflamed. The pancreas is a large gland located behind your stomach. The pancreas produces enzymes that help digest food. The pancreas also releases the hormones glucagon and insulin that help regulate blood sugar. Damage to the pancreas occurs when the digestive enzymes from the pancreas are activated and begin attacking the pancreas before being released into the intestine. Most acute attacks last a couple of days and can cause serious complications. Some people become dehydrated and develop low blood pressure. In severe cases, bleeding into the pancreas can lead to shock and can be life-threatening. The lungs, heart, and kidneys may fail. °CAUSES  °Pancreatitis can happen to anyone. In some cases, the cause is unknown. Most cases are caused by: °· Alcohol abuse. °· Gallstones. °Other less common causes are: °· Certain medicines. °· Exposure to certain chemicals. °· Infection. °· Damage caused by an accident (trauma). °· Abdominal surgery. °SYMPTOMS  °· Pain in the upper abdomen that may radiate to the back. °· Tenderness and swelling of the abdomen. °· Nausea and vomiting. °DIAGNOSIS  °Your caregiver will perform a physical exam. Blood and stool tests may be done to confirm the diagnosis. Imaging tests may also be done, such as X-rays, CT scans, or an ultrasound of the abdomen. °TREATMENT  °Treatment usually requires a stay in the hospital. Treatment may include: °· Pain medicine. °· Fluid replacement through an intravenous line (IV). °· Placing a tube in the stomach to remove stomach contents and control vomiting. °· Not eating for 3 or 4 days. This gives your pancreas a rest, because enzymes are not being produced that can cause further damage. °· Antibiotic medicines if your condition is caused by an infection. °· Surgery of the pancreas or gallbladder. °HOME CARE INSTRUCTIONS  °· Follow the diet advised by your  caregiver. This may involve avoiding alcohol and decreasing the amount of fat in your diet. °· Eat smaller, more frequent meals. This reduces the amount of digestive juices the pancreas produces. °· Drink enough fluids to keep your urine clear or pale yellow. °· Only take over-the-counter or prescription medicines as directed by your caregiver. °· Avoid drinking alcohol if it caused your condition. °· Do not smoke. °· Get plenty of rest. °· Check your blood sugar at home as directed by your caregiver. °· Keep all follow-up appointments as directed by your caregiver. °SEEK MEDICAL CARE IF:  °· You do not recover as quickly as expected. °· You develop new or worsening symptoms. °· You have persistent pain, weakness, or nausea. °· You recover and then have another episode of pain. °SEEK IMMEDIATE MEDICAL CARE IF:  °· You are unable to eat or keep fluids down. °· Your pain becomes severe. °· You have a fever or persistent symptoms for more than 2 to 3 days. °· You have a fever and your symptoms suddenly get worse. °· Your skin or the white part of your eyes turn yellow (jaundice). °· You develop vomiting. °· You feel dizzy, or you faint. °· Your blood sugar is high (over 300 mg/dL). °MAKE SURE YOU:  °· Understand these instructions. °· Will watch your condition. °· Will get help right away if you are not doing well or get worse. °Document Released: 07/06/2005 Document Revised: 01/05/2012 Document Reviewed: 10/15/2011 °Exit[employer]® Patient Information ©2014 [employer]. ° °

## 2013-10-21 NOTE — ED Notes (Signed)
C/o abdominal pain x two weeks.  Reports history of GI issues for years, also diagnosed with pancreatitis.  Pain is epigastric, no radiation.  Denies vomiting/diarrhea, c/o mild nausea.

## 2013-10-21 NOTE — ED Notes (Signed)
Patient transported to Ultrasound 

## 2013-10-21 NOTE — ED Notes (Signed)
Completed triage and patient stated 'Ive been having chest pains for several weeks, radiating into my back.  I've been taking Motrins".

## 2013-10-22 ENCOUNTER — Encounter: Payer: Self-pay | Admitting: Internal Medicine

## 2013-10-22 NOTE — ED Provider Notes (Signed)
Medical screening examination/treatment/procedure(s) were performed by non-physician practitioner and as supervising physician I was immediately available for consultation/collaboration.   EKG Interpretation   Date/Time:  Saturday October 21 2013 13:37:56 EDT Ventricular Rate:  77 PR Interval:  164 QRS Duration: 92 QT Interval:  382 QTC Calculation: 432 R Axis:   36 Text Interpretation:  Normal sinus rhythm Minimal voltage criteria for  LVH, may be normal variant No previous tracing Confirmed by Anitra LauthPLUNKETT  MD,  WHITNEY ([id number]) on 10/21/2013 1:52:56 PM        Randa SpikeForrest Mort SawyersS Chessa Barrasso, MD 10/22/13 0003

## 2013-10-24 ENCOUNTER — Encounter: Payer: Self-pay | Admitting: Internal Medicine

## 2013-11-08 ENCOUNTER — Other Ambulatory Visit: Payer: Self-pay | Admitting: Internal Medicine

## 2013-11-08 ENCOUNTER — Encounter: Payer: Self-pay | Admitting: Internal Medicine

## 2013-11-08 ENCOUNTER — Telehealth: Payer: Self-pay | Admitting: *Deleted

## 2013-11-08 ENCOUNTER — Ambulatory Visit (INDEPENDENT_AMBULATORY_CARE_PROVIDER_SITE_OTHER): Payer: No Typology Code available for payment source | Admitting: Internal Medicine

## 2013-11-08 VITALS — BP 150/85 | HR 74 | Temp 98.5°F | Resp 16 | Ht 65.0 in | Wt 165.0 lb

## 2013-11-08 DIAGNOSIS — K859 Acute pancreatitis without necrosis or infection, unspecified: Secondary | ICD-10-CM

## 2013-11-08 DIAGNOSIS — I10 Essential (primary) hypertension: Secondary | ICD-10-CM

## 2013-11-08 DIAGNOSIS — Z139 Encounter for screening, unspecified: Secondary | ICD-10-CM

## 2013-11-08 DIAGNOSIS — K219 Gastro-esophageal reflux disease without esophagitis: Secondary | ICD-10-CM

## 2013-11-08 DIAGNOSIS — K8020 Calculus of gallbladder without cholecystitis without obstruction: Secondary | ICD-10-CM | POA: Insufficient documentation

## 2013-11-08 DIAGNOSIS — L03119 Cellulitis of unspecified part of limb: Secondary | ICD-10-CM

## 2013-11-08 DIAGNOSIS — K869 Disease of pancreas, unspecified: Secondary | ICD-10-CM

## 2013-11-08 DIAGNOSIS — Z8041 Family history of malignant neoplasm of ovary: Secondary | ICD-10-CM

## 2013-11-08 DIAGNOSIS — F17200 Nicotine dependence, unspecified, uncomplicated: Secondary | ICD-10-CM

## 2013-11-08 DIAGNOSIS — L03115 Cellulitis of right lower limb: Secondary | ICD-10-CM

## 2013-11-08 DIAGNOSIS — L02419 Cutaneous abscess of limb, unspecified: Secondary | ICD-10-CM

## 2013-11-08 LAB — COMPREHENSIVE METABOLIC PANEL
ALK PHOS: 52 U/L (ref 39–117)
ALT: 8 U/L (ref 0–35)
AST: 13 U/L (ref 0–37)
Albumin: 4.2 g/dL (ref 3.5–5.2)
BILIRUBIN TOTAL: 0.7 mg/dL (ref 0.2–1.2)
BUN: 20 mg/dL (ref 6–23)
CO2: 27 meq/L (ref 19–32)
CREATININE: 0.95 mg/dL (ref 0.50–1.10)
Calcium: 9.4 mg/dL (ref 8.4–10.5)
Chloride: 106 mEq/L (ref 96–112)
GLUCOSE: 77 mg/dL (ref 70–99)
Potassium: 4.2 mEq/L (ref 3.5–5.3)
Sodium: 142 mEq/L (ref 135–145)
Total Protein: 6.9 g/dL (ref 6.0–8.3)

## 2013-11-08 LAB — CBC WITH DIFFERENTIAL/PLATELET
Basophils Absolute: 0 10*3/uL (ref 0.0–0.1)
Basophils Relative: 0 % (ref 0–1)
EOS ABS: 0.2 10*3/uL (ref 0.0–0.7)
EOS PCT: 3 % (ref 0–5)
HCT: 37.7 % (ref 36.0–46.0)
HEMOGLOBIN: 13.1 g/dL (ref 12.0–15.0)
LYMPHS ABS: 2 10*3/uL (ref 0.7–4.0)
Lymphocytes Relative: 39 % (ref 12–46)
MCH: 33.5 pg (ref 26.0–34.0)
MCHC: 34.7 g/dL (ref 30.0–36.0)
MCV: 96.4 fL (ref 78.0–100.0)
Monocytes Absolute: 0.4 10*3/uL (ref 0.1–1.0)
Monocytes Relative: 8 % (ref 3–12)
Neutro Abs: 2.6 10*3/uL (ref 1.7–7.7)
Neutrophils Relative %: 50 % (ref 43–77)
PLATELETS: 232 10*3/uL (ref 150–400)
RBC: 3.91 MIL/uL (ref 3.87–5.11)
RDW: 14.3 % (ref 11.5–15.5)
WBC: 5.2 10*3/uL (ref 4.0–10.5)

## 2013-11-08 LAB — LIPID PANEL
Cholesterol: 221 mg/dL — ABNORMAL HIGH (ref 0–200)
HDL: 69 mg/dL (ref >39)
LDL CALC: 142 mg/dL — AB (ref 0–99)
TRIGLYCERIDES: 49 mg/dL (ref <150)
Total CHOL/HDL Ratio: 3.2 Ratio
VLDL: 10 mg/dL (ref 0–40)

## 2013-11-08 LAB — LIPASE: Lipase: 45 U/L (ref 0–75)
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
## 2013-11-08 NOTE — Patient Instructions (Signed)
See me in 2-3 weeks  30 mins   Schedule Mri of abdome  Schedule CPE  To lab and mammogram today

## 2013-11-09 LAB — TSH: TSH: 0.177 u[IU]/mL — AB (ref 0.350–4.500)

## 2013-11-09 NOTE — Telephone Encounter (Signed)
Appointment

## 2013-11-10 ENCOUNTER — Ambulatory Visit (HOSPITAL_BASED_OUTPATIENT_CLINIC_OR_DEPARTMENT_OTHER)
Admission: RE | Admit: 2013-11-10 | Discharge: 2013-11-10 | Disposition: A | Discharge status: Home / Self Care | Payer: No Typology Code available for payment source | Source: Ambulatory Visit | Attending: Internal Medicine | Admitting: Internal Medicine

## 2013-11-10 DIAGNOSIS — Z1231 Encounter for screening mammogram for malignant neoplasm of breast: Secondary | ICD-10-CM | POA: Insufficient documentation

## 2013-11-13 ENCOUNTER — Other Ambulatory Visit: Payer: Self-pay | Admitting: *Deleted

## 2013-11-13 LAB — T3, FREE: T3, Free: 2.9 pg/mL (ref 2.3–4.2)

## 2013-11-13 LAB — T4, FREE: Free T4: 1.16 ng/dL (ref 0.80–1.80)

## 2013-11-14 ENCOUNTER — Other Ambulatory Visit: Payer: Self-pay | Admitting: Internal Medicine

## 2013-11-14 DIAGNOSIS — R928 Other abnormal and inconclusive findings on diagnostic imaging of breast: Secondary | ICD-10-CM

## 2013-11-15 ENCOUNTER — Encounter: Payer: Self-pay | Admitting: Internal Medicine

## 2013-11-15 ENCOUNTER — Ambulatory Visit (INDEPENDENT_AMBULATORY_CARE_PROVIDER_SITE_OTHER): Payer: No Typology Code available for payment source | Admitting: Internal Medicine

## 2013-11-15 VITALS — BP 158/95 | HR 66 | Temp 98.2°F | Resp 16

## 2013-11-15 DIAGNOSIS — R932 Abnormal findings on diagnostic imaging of liver and biliary tract: Secondary | ICD-10-CM

## 2013-11-15 DIAGNOSIS — I10 Essential (primary) hypertension: Secondary | ICD-10-CM

## 2013-11-15 DIAGNOSIS — R928 Other abnormal and inconclusive findings on diagnostic imaging of breast: Secondary | ICD-10-CM

## 2013-11-15 MED ORDER — HYDROCHLOROTHIAZIDE 25 MG PO TABS: 25.0000 mg | ORAL_TABLET | Freq: Every day | ORAL | Status: DC

## 2013-11-15 NOTE — Progress Notes (Signed)
Subjective:    Patient ID: Kelly MaudlinBelinda Callahan, female    DOB: [birth year], 58 y.o.   MRN: [id number]  HPI   Kelly BougieBelinda is here for follow up of abnormal mm. Possible mass in Right breast .  No FH of breast CA   See labs TSH slightly suppressed with normal free levels .   Pt is recovering form pancreatitis  Lipase now normal  She is pain free  She reports no self reported mass in either breast   She does not take her lisinopril - she would like to be on HCTZ that she has been on in the past  Allergies  Allergen Reactions  . Levaquin [Levofloxacin In D5w] Hives  . Levaquin [Levofloxacin]   . Penicillins     rash   Past Medical History  Diagnosis Date  . Cellulitis 05/2013    RT LEG SPIDER BITE  . GERD (gastroesophageal reflux disease)   . Hypertension     PATIENT STATES SHE DOES NOT FEEL SHE HAS HTN   . Pancreatitis    Past Surgical History  Procedure Laterality Date  . Cesarean section     History   Social History  . Marital Status: Single    Spouse Name: N/A    Number of Children: N/A  . Years of Education: N/A   Occupational History  . Not on file.   Social History Main Topics  . Smoking status: Current Every Day Smoker -- 0.50 packs/day for 30 years    Types: Cigarettes  . Smokeless tobacco: Never Used     Comment: Trying to cut back.  . Alcohol Use: No  . Drug Use: No  . Sexual Activity: No   Other Topics Concern  . Not on file   Social History Narrative  . No narrative on file   Family History  Problem Relation Age of Onset  . Cancer Mother 6765    ovarian  . Hypertension Mother   . Cancer Father     sarcoma  . Hypertension Sister   . Hypertension Brother    Patient Active Problem List   Diagnosis Date Noted  . Pancreatitis 11/08/2013  . GERD (gastroesophageal reflux disease) 11/08/2013  . FH: ovarian cancer  mother 11/08/2013  . Tobacco use disorder 11/08/2013  . Cholelithiasis 11/08/2013  . Calcification of lung 07/03/2013  . Inguinal  adenopathy 07/03/2013  . 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 Essential hypertension, benign 06/29/2013  . Cellulitis of right lower extremity 06/18/2013   Current Outpatient Prescriptions on File Prior to Visit  Medication Sig Dispense Refill  . lisinopril (PRINIVIL,ZESTRIL) 20 MG tablet Take 20 mg by mouth daily.      Kelly Callahan Kitchen. omeprazole (PRILOSEC) 40 MG capsule Take 40 mg by mouth daily.       No current facility-administered medications on file prior to visit.     Review of Systems See HPI    Objective:   Physical Exam  Physical Exam  Nursing note and vitals reviewed.  Constitutional: She is oriented to person, place, and time. She appears well-developed and well-nourished.  HENT:  Head: Normocephalic and atraumatic.  Neck  No thyromegaly no nodules on exam.  No cervical adenopathy Cardiovascular: Normal rate and regular rhythm. Exam reveals no gallop and no friction rub.  No murmur heard.  Pulmonary/Chest: Breath sounds normal. She has no wheezes. She has no rales.  Breast :  Left no discrete mass no nipple discharge no axillary adenopathy bilaterally.  R breast no discrete mass but thickening  near nipple 8 oclock.  No discharge no axillary adenopathy Neurological: She is alert and oriented to person, place, and time.  Skin: Skin is warm and dry.  Psychiatric: She has a normal mood and affect. Her behavior is normal.          Assessment & Plan:  HTN  She will not take lisinopril.  Will start HCTZ and advised ot take daily.  See me in 4-6 weeks will need K then  Abnormal mm   Pt advised she will need diagnostic mm.  She tells me she has not heard from Imaging as yet  Pancreatitis  Will get follow up MRI  Advised pt to stop by xray to schedule  See me in 4-6 weeks

## 2013-11-15 NOTE — Patient Instructions (Signed)
See me early June  30 mins  Pt needs diagnostic mm at breast center.  Give her number to call  To schedule  Pt needs to stop by pharmacy and xray downstairs

## 2013-11-18 ENCOUNTER — Ambulatory Visit (HOSPITAL_BASED_OUTPATIENT_CLINIC_OR_DEPARTMENT_OTHER)
Admission: RE | Admit: 2013-11-18 | Discharge: 2013-11-18 | Disposition: A | Discharge status: Home / Self Care | Payer: No Typology Code available for payment source | Source: Ambulatory Visit | Attending: Internal Medicine | Admitting: Internal Medicine

## 2013-11-18 DIAGNOSIS — K8689 Other specified diseases of pancreas: Secondary | ICD-10-CM | POA: Insufficient documentation

## 2013-11-18 DIAGNOSIS — K869 Disease of pancreas, unspecified: Secondary | ICD-10-CM

## 2013-11-18 DIAGNOSIS — K8020 Calculus of gallbladder without cholecystitis without obstruction: Secondary | ICD-10-CM | POA: Insufficient documentation

## 2013-11-18 DIAGNOSIS — R1013 Epigastric pain: Secondary | ICD-10-CM | POA: Insufficient documentation

## 2013-11-18 MED ORDER — GADOBENATE DIMEGLUMINE 529 MG/ML IV SOLN: 15.0000 mL | Freq: Once | INTRAVENOUS | Status: AC | PRN

## 2013-11-23 ENCOUNTER — Telehealth: Payer: Self-pay | Admitting: Internal Medicine

## 2013-11-23 NOTE — Telephone Encounter (Signed)
Left message at [phone]  To call office regarding MRI abd resulst

## 2013-11-23 NOTE — Telephone Encounter (Signed)
Left message again at [phone]  To discuss Mri results.  ADvised pt to call office

## 2013-11-24 ENCOUNTER — Ambulatory Visit
Admission: RE | Admit: 2013-11-24 | Discharge: 2013-11-24 | Disposition: A | Discharge status: Home / Self Care | Payer: No Typology Code available for payment source | Source: Ambulatory Visit | Attending: Internal Medicine | Admitting: Internal Medicine

## 2013-11-27 ENCOUNTER — Telehealth: Payer: Self-pay | Admitting: Internal Medicine

## 2013-11-27 NOTE — Telephone Encounter (Signed)
Left message on two numbers  [phone] and [phone] for pt to call office regarding her MRI results  Unable to leave message ata the [phone] number

## 2013-11-28 ENCOUNTER — Telehealth: Payer: Self-pay | Admitting: Internal Medicine

## 2013-11-28 DIAGNOSIS — K859 Acute pancreatitis without necrosis or infection, unspecified: Secondary | ICD-10-CM

## 2013-11-28 NOTE — Telephone Encounter (Signed)
Left phone message with Latanya MaudlinBelinda Vittorio, Kerby LessVeronica Toro and Cyndie MullCharitta Roach to have pt call our office regarding her test results  Left message on following nunbers.   [phone], [phone], and [phone]

## 2013-11-28 NOTE — Telephone Encounter (Signed)
Spoke with pt and informed of all imaging results  Informed of enlarged pancreatic head but no mass.  She does have gallstones pt informed.  Will refer to Gi .  She would [facility] [place] [facility]  Informed that breast ultrasound shows no mass    Pt advised to keep follow up with me

## 2013-12-19 ENCOUNTER — Ambulatory Visit: Payer: No Typology Code available for payment source | Admitting: Internal Medicine

## 2013-12-26 ENCOUNTER — Telehealth: Payer: Self-pay | Admitting: *Deleted

## 2013-12-26 NOTE — Telephone Encounter (Signed)
Pt states that she seen a GI Doctor in [facility] and was told she needed to see someone in [place] for an endoscopy. Pt wants to know if there is somewhere that she can be seen around here. To have this done

## 2013-12-27 NOTE — Telephone Encounter (Signed)
There is no way I can answer this question without the clinical information and what type of endoscopy she needs.  The Gi MD that she saw in [facility] should be able to give her several options of providers.  Have her call and speak to the nurse of the provider at that office for options.

## 2014-01-02 ENCOUNTER — Encounter: Payer: Self-pay | Admitting: Internal Medicine

## 2014-01-02 ENCOUNTER — Ambulatory Visit (HOSPITAL_BASED_OUTPATIENT_CLINIC_OR_DEPARTMENT_OTHER)
Admission: RE | Admit: 2014-01-02 | Discharge: 2014-01-02 | Disposition: A | Discharge status: Home / Self Care | Payer: No Typology Code available for payment source | Source: Ambulatory Visit | Attending: Internal Medicine | Admitting: Internal Medicine

## 2014-01-02 ENCOUNTER — Ambulatory Visit (INDEPENDENT_AMBULATORY_CARE_PROVIDER_SITE_OTHER): Payer: No Typology Code available for payment source | Admitting: Internal Medicine

## 2014-01-02 ENCOUNTER — Other Ambulatory Visit: Payer: Self-pay | Admitting: Internal Medicine

## 2014-01-02 VITALS — BP 120/81 | HR 91 | Resp 16 | Ht 64.0 in | Wt 159.0 lb

## 2014-01-02 DIAGNOSIS — I10 Essential (primary) hypertension: Secondary | ICD-10-CM

## 2014-01-02 DIAGNOSIS — K859 Acute pancreatitis without necrosis or infection, unspecified: Secondary | ICD-10-CM

## 2014-01-02 DIAGNOSIS — R7989 Other specified abnormal findings of blood chemistry: Secondary | ICD-10-CM

## 2014-01-02 DIAGNOSIS — E042 Nontoxic multinodular goiter: Secondary | ICD-10-CM | POA: Insufficient documentation

## 2014-01-02 DIAGNOSIS — R932 Abnormal findings on diagnostic imaging of liver and biliary tract: Secondary | ICD-10-CM

## 2014-01-02 DIAGNOSIS — Z139 Encounter for screening, unspecified: Secondary | ICD-10-CM

## 2014-01-02 DIAGNOSIS — F17200 Nicotine dependence, unspecified, uncomplicated: Secondary | ICD-10-CM

## 2014-01-02 DIAGNOSIS — R946 Abnormal results of thyroid function studies: Secondary | ICD-10-CM

## 2014-01-03 ENCOUNTER — Encounter: Payer: Self-pay | Admitting: Internal Medicine

## 2014-01-03 ENCOUNTER — Telehealth: Payer: Self-pay | Admitting: Internal Medicine

## 2014-01-03 DIAGNOSIS — E049 Nontoxic goiter, unspecified: Secondary | ICD-10-CM

## 2014-01-03 LAB — COMPREHENSIVE METABOLIC PANEL
ALBUMIN: 4.2 g/dL (ref 3.5–5.2)
ALK PHOS: 51 U/L (ref 39–117)
ALT: <8 U/L (ref 0–35)
AST: 15 U/L (ref 0–37)
BUN: 18 mg/dL (ref 6–23)
CALCIUM: 9.7 mg/dL (ref 8.4–10.5)
CHLORIDE: 107 meq/L (ref 96–112)
CO2: 27 mEq/L (ref 19–32)
Creat: 1.04 mg/dL (ref 0.50–1.10)
Glucose, Bld: 84 mg/dL (ref 70–99)
Potassium: 4 mEq/L (ref 3.5–5.3)
SODIUM: 142 meq/L (ref 135–145)
TOTAL PROTEIN: 6.9 g/dL (ref 6.0–8.3)
Total Bilirubin: 0.5 mg/dL (ref 0.2–1.2)

## 2014-01-03 LAB — T4, FREE: FREE T4: 1 ng/dL (ref 0.80–1.80)

## 2014-01-03 LAB — TSH: TSH: 0.441 u[IU]/mL (ref 0.350–4.500)

## 2014-01-03 LAB — T3, FREE: T3 FREE: 3 pg/mL (ref 2.3–4.2)

## 2014-01-03 NOTE — Telephone Encounter (Signed)
Spoke with pt and informed of labs and ultrasound result  Will refero to endorine

## 2014-01-03 NOTE — Patient Instructions (Signed)
To ultrasound today    To lab today

## 2014-01-03 NOTE — Progress Notes (Signed)
Subjective:    Patient ID: Kelly Callahan, female    DOB: 14-Jun-1956, 58 y.o.   MRN: [id number]  HPI Kelly Callahan is here to follow up on several issues:  Abnormal pancreas on imaging,  Abnormal TSH value, and HTN  HTN  She does not like lisinopril and will not take this.  She reports she is taking 1/2 of her HCTZ only no headache no chest pain  No LE edema  Enlargement of pancreatic head:  She has [facility] who did EUS with FNA on 6/11.  Pathology is pending. (see scanned procedure).  Findings suggestive for autoimmune pancreatitis.    Supressed TSH.  She complains of fatigue,  No weight loss, no hyperdefecation , no visual changes  Tobacco use not interested in smoking cessation  Allergies  Allergen Reactions  . Levaquin [Levofloxacin In D5w] Hives  . Penicillins     rash   Past Medical History  Diagnosis Date  . Cellulitis 05/2013    RT LEG SPIDER BITE  . GERD (gastroesophageal reflux disease)   . Hypertension     PATIENT STATES SHE DOES NOT FEEL SHE HAS HTN   . Pancreatitis    Past Surgical History  Procedure Laterality Date  . Cesarean section     History   Social History  . Marital Status: Single    Spouse Name: N/A    Number of Children: N/A  . Years of Education: N/A   Occupational History  . Not on file.   Social History Main Topics  . Smoking status: Current Every Day Smoker -- 0.50 packs/day for 30 years    Types: Cigarettes  . Smokeless tobacco: Never Used     Comment: Trying to cut back.  . Alcohol Use: No  . Drug Use: No  . Sexual Activity: No   Other Topics Concern  . Not on file   Social History Narrative  . No narrative on file   Family History  Problem Relation Age of Onset  . Cancer Mother 365    ovarian  . Hypertension Mother   . Cancer Father     sarcoma  . Hypertension Sister   . Hypertension Brother    Patient Active Problem List   Diagnosis Date Noted  . Abnormal TSH 01/02/2014  . Abnormal CT scan, pancreas  or bile duct 11/15/2013  . Abnormal mammogram  see mm 10/2013 11/15/2013  . Pancreatitis 11/08/2013  . GERD (gastroesophageal reflux disease) 11/08/2013  . FH: ovarian cancer  mother 11/08/2013  . Tobacco use disorder 11/08/2013  . Cholelithiasis 11/08/2013  . Calcification of lung 07/03/2013  . Inguinal adenopathy 07/03/2013  . 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 Essential hypertension, benign 06/29/2013  . Cellulitis of right lower extremity 06/18/2013   Current Outpatient Prescriptions on File Prior to Visit  Medication Sig Dispense Refill  . hydrochlorothiazide (HYDRODIURIL) 25 MG tablet Take 1 tablet (25 mg total) by mouth daily.  60 tablet  0  . omeprazole (PRILOSEC) 40 MG capsule Take 40 mg by mouth daily.       No current facility-administered medications on file prior to visit.       Review of Systems See HPI    Objective:   Physical Exam Physical Exam  Nursing note and vitals reviewed.  Constitutional: She is oriented to person, place, and time. She appears well-developed and well-nourished.  HENT:  Head: Normocephalic and atraumatic.  Neck  Thyroid enlargement to palpation no discrete nodules to my exam Cardiovascular:  Normal rate and regular rhythm. Exam reveals no gallop and no friction rub.  No murmur heard.  Pulmonary/Chest: Breath sounds normal. She has no wheezes. She has no rales.  Neurological: She is alert and oriented to person, place, and time.  Skin: Skin is warm and dry.  Psychiatric: She has a normal mood and affect. Her behavior is normal.        Assessment & Plan:  HTN  Continue HCTZ which she tolerataes.  Check K today  Enlarged pancrease:  Pathology pending  Pt counseled to be sure to follow with Dr. Lanell MatarMishra  ?? Auto-immune  In nature  Enlarged thyroid  Will recheck TSH with free levels,  Thyroid ultrasound today  Further management based on results.   Tobacco used advised cessation

## 2014-01-04 ENCOUNTER — Encounter: Payer: Self-pay | Admitting: *Deleted

## 2014-01-09 ENCOUNTER — Ambulatory Visit (INDEPENDENT_AMBULATORY_CARE_PROVIDER_SITE_OTHER): Payer: No Typology Code available for payment source | Admitting: Internal Medicine

## 2014-01-09 ENCOUNTER — Encounter: Payer: Self-pay | Admitting: Internal Medicine

## 2014-01-09 VITALS — BP 120/66 | HR 90 | Resp 16 | Ht 65.0 in | Wt 161.0 lb

## 2014-01-09 DIAGNOSIS — R932 Abnormal findings on diagnostic imaging of liver and biliary tract: Secondary | ICD-10-CM

## 2014-01-09 DIAGNOSIS — E049 Nontoxic goiter, unspecified: Secondary | ICD-10-CM

## 2014-01-09 DIAGNOSIS — K861 Other chronic pancreatitis: Secondary | ICD-10-CM

## 2014-01-09 NOTE — Progress Notes (Addendum)
Subjective:    Patient ID: Kelly Callahan, female    DOB: [birth year], 58 y.o.   MRN: [id number]  HPI Kelly BougieBelinda is here for follow up  She had thyroid U/S which confirmed enlargement and small subcentimeter bilateral nodules.  She has fatigue but  no skin, hair changes no constipation  She reports that she received a call from Dr. Lanell MatarMishra today who told pt her pancreatic biopsy was normal  I do not have report  Allergies  Allergen Reactions  . Levaquin [Levofloxacin In D5w] Hives  . Penicillins     rash   Past Medical History  Diagnosis Date  . Cellulitis 05/2013    RT LEG SPIDER BITE  . GERD (gastroesophageal reflux disease)   . Hypertension     PATIENT STATES SHE DOES NOT FEEL SHE HAS HTN   . Pancreatitis    Past Surgical History  Procedure Laterality Date  . Cesarean section     History   Social History  . Marital Status: Single    Spouse Name: N/A    Number of Children: N/A  . Years of Education: N/A   Occupational History  . Not on file.   Social History Main Topics  . Smoking status: Current Every Day Smoker -- 0.50 packs/day for 30 years    Types: Cigarettes  . Smokeless tobacco: Never Used     Comment: Trying to cut back.  . Alcohol Use: No  . Drug Use: No  . Sexual Activity: No   Other Topics Concern  . Not on file   Social History Narrative  . No narrative on file   Family History  Problem Relation Age of Onset  . Cancer Mother 7465    ovarian  . Hypertension Mother   . Cancer Father     sarcoma  . Hypertension Sister   . Hypertension Brother    Patient Active Problem List   Diagnosis Date Noted  . Abnormal TSH 01/02/2014  . Abnormal CT scan, pancreas or bile duct 11/15/2013  . Abnormal mammogram  see mm 10/2013 11/15/2013  . Pancreatitis 11/08/2013  . GERD (gastroesophageal reflux disease) 11/08/2013  . FH: ovarian cancer  mother 11/08/2013  . Tobacco use disorder 11/08/2013  . Cholelithiasis 11/08/2013  . Calcification of lung  07/03/2013  . Inguinal adenopathy 07/03/2013  . 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 Essential hypertension, benign 06/29/2013  . Cellulitis of right lower extremity 06/18/2013   Current Outpatient Prescriptions on File Prior to Visit  Medication Sig Dispense Refill  . hydrochlorothiazide (HYDRODIURIL) 25 MG tablet Take 1 tablet (25 mg total) by mouth daily.  60 tablet  0  . omeprazole (PRILOSEC) 40 MG capsule Take 40 mg by mouth daily.      Marland Kitchen. oxyCODONE-acetaminophen (PERCOCET/ROXICET) 5-325 MG per tablet       . triamcinolone ointment (KENALOG) 0.1 % Apply to itchy areas of body (not face) 1-2 times daily as needed       No current facility-administered medications on file prior to visit.       Review of Systems See HPI    Objective:   Physical Exam  Physical Exam  Nursing note and vitals reviewed.  Constitutional: She is oriented to person, place, and time. She appears well-developed and well-nourished.  HENT:  Head: Normocephalic and atraumatic.  Cardiovascular: Normal rate and regular rhythm. Exam reveals no gallop and no friction rub.  No murmur heard.  Pulmonary/Chest: Breath sounds normal. She has no wheezes. She has no rales.  Neurological: She is alert and oriented to person, place, and time.  Skin: Skin is warm and dry.  Psychiatric: She has a normal mood and affect. Her behavior is normal.         Assessment & Plan:  Thyroid enlargement   Pt had appt with Dr. Elvera LennoxGherghe  Upcoming.   Pancreatitis/pancreatic enlargement   BX neg for malignancy per pt report.  ADvised to keep follow up appt with GI and Dr. Lanell MatarMishra  Gallstones  She is asymptomatic now  Keep follow up appt with me   Addendum  8/29  Pt cancelled her appt with Dr. Elvera LennoxGherghe and I received note that she no showed for her appt with [facility] note dated 03/01/14

## 2014-01-16 ENCOUNTER — Telehealth: Payer: Self-pay | Admitting: *Deleted

## 2014-01-16 NOTE — Telephone Encounter (Signed)
Kelly Callahan called she would like a referral to a [facility] because her co-pay at [facility] is $100 since they are a Tier 2.

## 2014-01-17 ENCOUNTER — Ambulatory Visit: Payer: No Typology Code available for payment source | Admitting: Internal Medicine

## 2014-01-18 NOTE — Telephone Encounter (Signed)
Called patient to let her know she has an appointment with [facility] on 03/01/14 at 2:00 pm.

## 2014-04-02 ENCOUNTER — Encounter: Payer: No Typology Code available for payment source | Admitting: Internal Medicine

## 2014-04-09 NOTE — Telephone Encounter (Signed)
error 

## 2014-05-21 ENCOUNTER — Encounter: Payer: Self-pay | Admitting: Internal Medicine

## 2015-11-04 ENCOUNTER — Other Ambulatory Visit: Payer: Self-pay | Admitting: Gastroenterology

## 2015-11-04 DIAGNOSIS — R110 Nausea: Secondary | ICD-10-CM

## 2015-11-12 ENCOUNTER — Other Ambulatory Visit: Payer: No Typology Code available for payment source

## 2015-11-12 ENCOUNTER — Ambulatory Visit
Admission: RE | Admit: 2015-11-12 | Discharge: 2015-11-12 | Disposition: A | Discharge status: Home / Self Care | Payer: BLUE CROSS/BLUE SHIELD | Source: Ambulatory Visit | Attending: Gastroenterology | Admitting: Gastroenterology

## 2015-11-12 DIAGNOSIS — R110 Nausea: Secondary | ICD-10-CM

## 2016-04-30 IMAGING — US US ABDOMEN COMPLETE
1 series · 13 of 25 positions shown · non-contrast
Comparison: Abdominal and pelvic CT scan and MRI scan July 16, 2014, and abdominal ultrasound October 21, 2013

CLINICAL DATA: History of pancreatitis, currently asymptomatic,
history of gallstones and hepatic cysts and enlarged pancreatic head

EXAM:
ABDOMEN ULTRASOUND COMPLETE

[Series 1: us abdomen complete · 0.17mm/px · 13 of 95 slices shown]
[im 1/95]
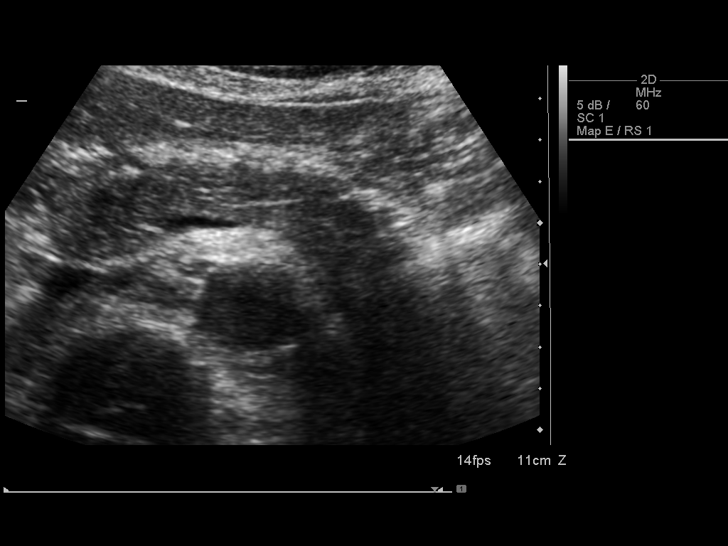
[im 8/95]
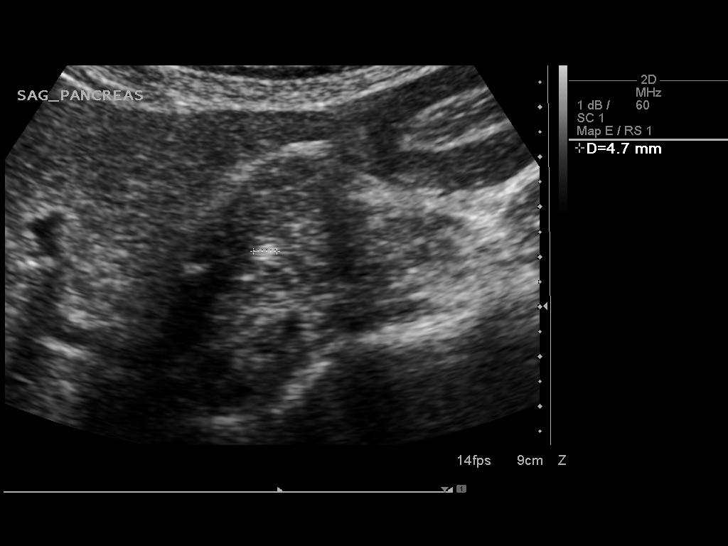
[im 16/95]
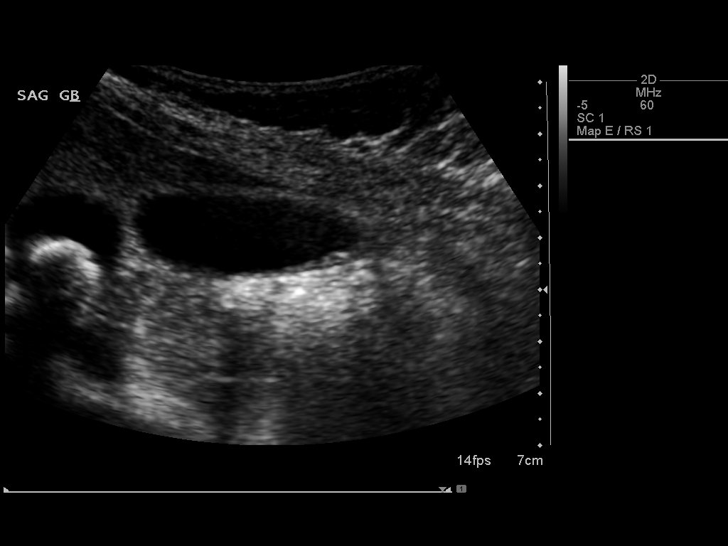
[im 24/95]
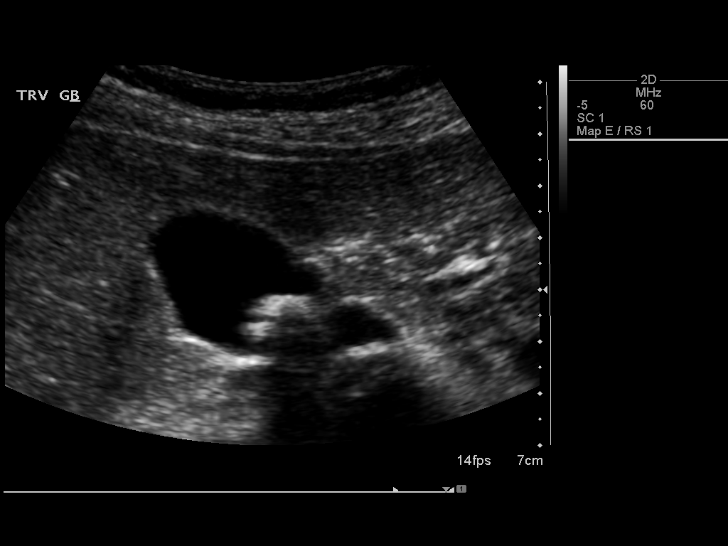
[im 32/95]
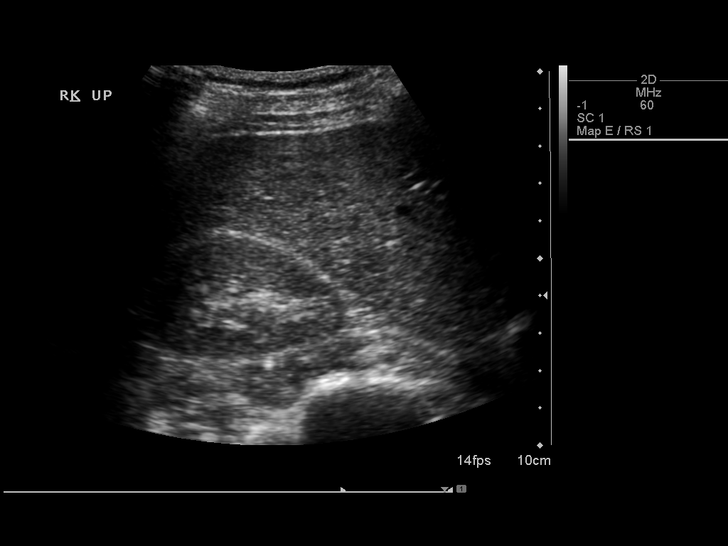
[im 40/95]
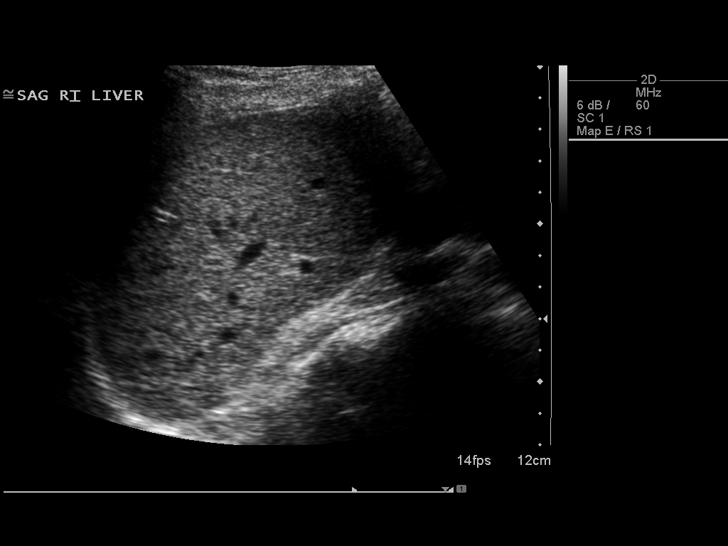
[im 48/95]
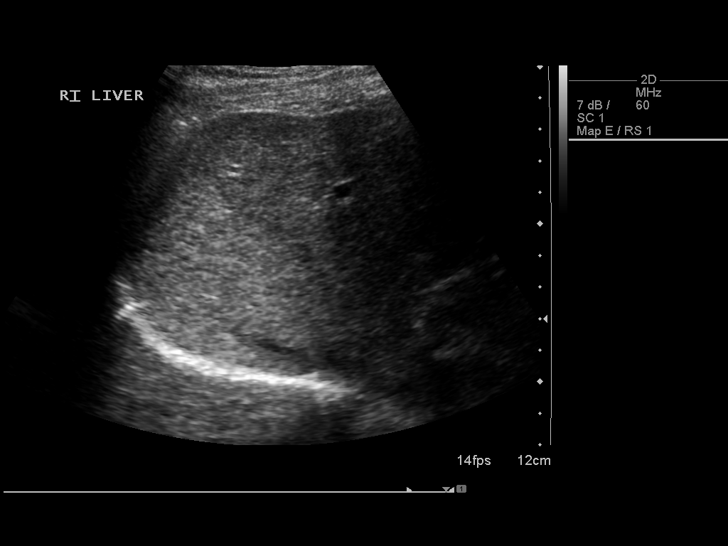
[im 55/95]
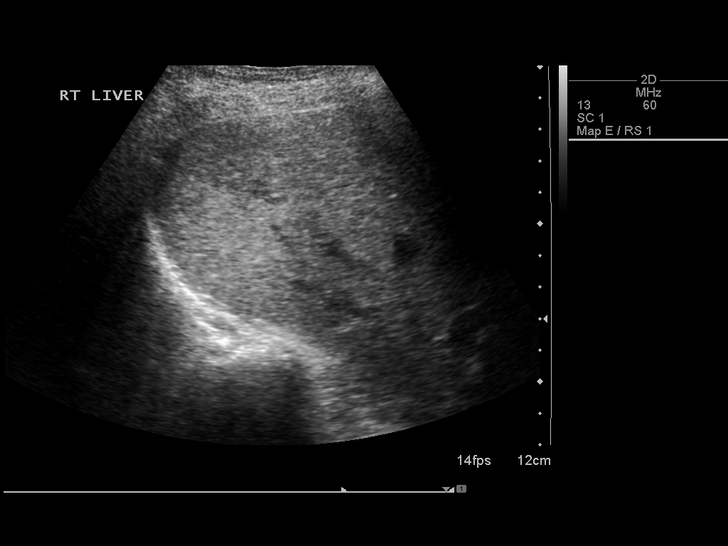
[im 63/95]
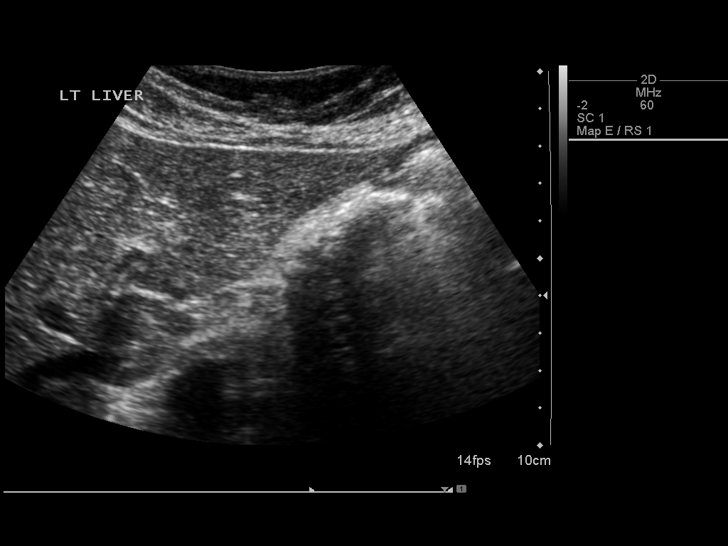
[im 71/95]
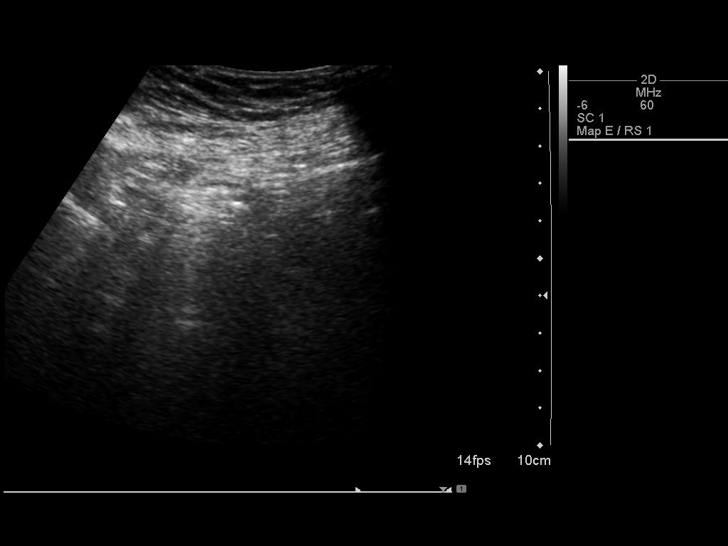
[im 79/95]
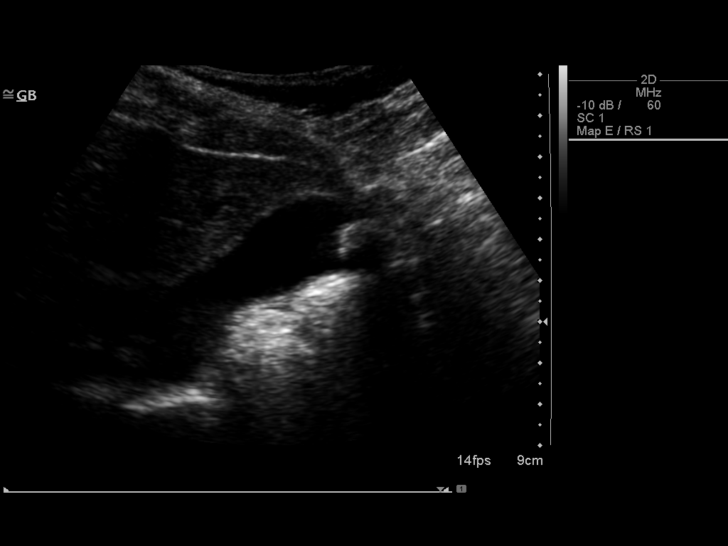
[im 87/95]
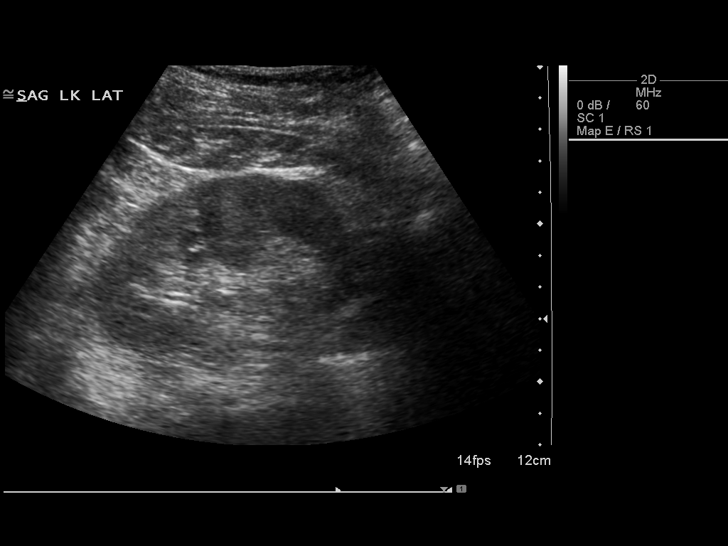
[im 95/95]
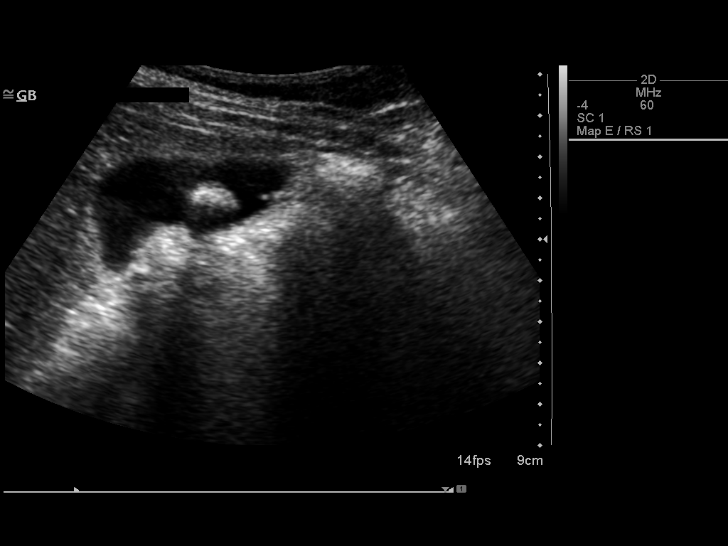

[13 of 25 positions shown; findings below may reference images not displayed]

FINDINGS: Gallbladder: The gallbladder is adequately distended. There is an
echogenic mobile stone. Sludge is present. There is no positive
sonographic Murphy's sign. There is no gallbladder wall thickening
or pericholecystic fluid.

Common bile duct: Diameter: 2.3- 3.8 mm

Liver: No focal lesion identified. Within normal limits in
parenchymal echogenicity.

IVC: No abnormality visualized.

Pancreas: Bowel gas limits evaluation of the pancreas. The the
pancreatic head cannot be well evaluated.

Spleen: The spleen was obscured by bowel gas.

Right Kidney: Length: 10.6 cm. Echogenicity within normal limits. No
mass or hydronephrosis visualized.

Left Kidney: Length: 9.9 cm. Detail of the left kidney was limited
due to bowel gas. There is no hydronephrosis.

Abdominal aorta: 2.8 cm. Much of the mid and distal aorta was
obscured by bowel gas.

Other findings: No ascites is observed.
IMPRESSION: 1. The study is limited due to bowel gas.
2. One mobile gallstone with sludge without sonographic evidence of
acute cholecystitis.
3. Normal appearance of the common bile duct without evidence of
intraluminal stones. The liver is normal in appearance.
4. Evaluation the pancreas is limited by bowel gas.

## 2018-06-23 ENCOUNTER — Emergency Department (HOSPITAL_BASED_OUTPATIENT_CLINIC_OR_DEPARTMENT_OTHER)
Admission: EM | Admit: 2018-06-23 | Discharge: 2018-06-23 | Disposition: A | Discharge status: Home / Self Care | Payer: PRIVATE HEALTH INSURANCE | Attending: Emergency Medicine | Admitting: Emergency Medicine

## 2018-06-23 ENCOUNTER — Other Ambulatory Visit: Payer: Self-pay

## 2018-06-23 ENCOUNTER — Encounter (HOSPITAL_BASED_OUTPATIENT_CLINIC_OR_DEPARTMENT_OTHER): Payer: Self-pay

## 2018-06-23 ENCOUNTER — Emergency Department (HOSPITAL_BASED_OUTPATIENT_CLINIC_OR_DEPARTMENT_OTHER): Payer: PRIVATE HEALTH INSURANCE

## 2018-06-23 DIAGNOSIS — F17210 Nicotine dependence, cigarettes, uncomplicated: Secondary | ICD-10-CM | POA: Insufficient documentation

## 2018-06-23 DIAGNOSIS — Z79899 Other long term (current) drug therapy: Secondary | ICD-10-CM | POA: Diagnosis not present

## 2018-06-23 DIAGNOSIS — R05 Cough: Secondary | ICD-10-CM | POA: Diagnosis present

## 2018-06-23 DIAGNOSIS — I10 Essential (primary) hypertension: Secondary | ICD-10-CM | POA: Diagnosis not present

## 2018-06-23 DIAGNOSIS — J189 Pneumonia, unspecified organism: Secondary | ICD-10-CM | POA: Insufficient documentation

## 2018-06-23 MED ORDER — PREDNISONE 20 MG PO TABS: ORAL_TABLET | ORAL | 0 refills | Status: DC

## 2018-06-23 MED ORDER — ALBUTEROL SULFATE HFA 108 (90 BASE) MCG/ACT IN AERS
2.0000 | INHALATION_SPRAY | Freq: Once | RESPIRATORY_TRACT | Status: AC
Start: 2018-06-23 — End: 2018-06-23
  Administered 2018-06-23: 2 via RESPIRATORY_TRACT
  Filled 2018-06-23: qty 6.7

## 2018-06-23 MED ORDER — DOXYCYCLINE HYCLATE 100 MG PO CAPS: 100.0000 mg | ORAL_CAPSULE | Freq: Two times a day (BID) | ORAL | 0 refills | Status: DC

## 2018-06-23 MED ORDER — DOXYCYCLINE HYCLATE 100 MG PO TABS
100.0000 mg | ORAL_TABLET | Freq: Once | ORAL | Status: AC
  Administered 2018-06-23: 100 mg via ORAL
  Filled 2018-06-23: qty 1

## 2018-06-23 MED ORDER — PREDNISONE 50 MG PO TABS
60.0000 mg | ORAL_TABLET | Freq: Once | ORAL | Status: AC
  Administered 2018-06-23: 60 mg via ORAL
  Filled 2018-06-23: qty 1

## 2018-06-23 NOTE — ED Notes (Signed)
ED Provider at bedside. 

## 2018-06-23 NOTE — ED Triage Notes (Addendum)
C/o flu like sx day 2-NAD-steady gait-no flu vaccine

## 2018-06-24 NOTE — ED Provider Notes (Signed)
[facility] EMERGENCY DEPARTMENT Provider Note   CSN: [id number] Arrival date & time: 06/23/18  1823     History   Chief Complaint Chief Complaint  Patient presents with  . Cough    HPI Kelly Callahan is a 62 y.o. female.  The history is provided by the patient.  Cough  This is a new problem. The current episode started more than 2 days ago. The problem occurs constantly. The cough is productive of sputum. There has been no fever. Associated symptoms include ear congestion, rhinorrhea, sore throat, shortness of breath and wheezing. Pertinent negatives include no chest pain.    Past Medical History:  Diagnosis Date  . Cellulitis 05/2013   RT LEG SPIDER BITE  . GERD (gastroesophageal reflux disease)   . Hypertension    PATIENT STATES SHE DOES NOT FEEL SHE HAS HTN   . Pancreatitis     Patient Active Problem List   Diagnosis Date Noted  . Thyroid enlargement 01/09/2014  . Abnormal TSH 01/02/2014  . Abnormal CT scan, pancreas or bile duct  S/P bx 12/2013  [facility] 11/15/2013  . Abnormal mammogram  see mm 10/2013 11/15/2013  . Pancreatitis 11/08/2013  . GERD (gastroesophageal reflux disease) 11/08/2013  . FH: ovarian cancer  mother 11/08/2013  . Tobacco use disorder 11/08/2013  . Cholelithiasis 11/08/2013  . Calcification of lung 07/03/2013  . Inguinal adenopathy 07/03/2013  . 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 Essential hypertension, benign 06/29/2013  . Cellulitis of right lower extremity 06/18/2013    Past Surgical History:  Procedure Laterality Date  . CESAREAN SECTION       OB History    Gravida  5   Para      Term      Preterm      AB  1   Living  4     SAB      TAB      Ectopic      Multiple      Live Births               Home Medications    Prior to Admission medications   Medication Sig Start Date End Date Taking? Authorizing Provider  doxycycline (VIBRAMYCIN) 100 MG capsule Take 1 capsule (100 mg total) by mouth 2  (two) times daily. One po bid x 7 days 06/23/18   Mora Pedraza, Barbara Cower, MD  hydrochlorothiazide (HYDRODIURIL) 25 MG tablet Take 1 tablet (25 mg total) by mouth daily. 11/15/13   Schoenhoff, Harrington Challenger, MD  omeprazole (PRILOSEC) 40 MG capsule Take 40 mg by mouth daily.    [provider]  oxyCODONE-acetaminophen (PERCOCET/ROXICET) 5-325 MG per tablet  10/21/13   [provider]  predniSONE (DELTASONE) 20 MG tablet 2 tabs po daily x 4 days 06/23/18   Jacqualynn Parco, Barbara Cower, MD    Family History Family History  Problem Relation Age of Onset  . Cancer Mother 26       ovarian  . Hypertension Mother   . Cancer Father        sarcoma  . Hypertension Sister   . Hypertension Brother     Social History Social History   Tobacco Use  . Smoking status: Current Every Day Smoker    Packs/day: 0.50    Years: 30.00    Pack years: 15.00    Types: Cigarettes  . Smokeless tobacco: Never Used  Substance Use Topics  . Alcohol use: No  . Drug use: No     Allergies  Levaquin [levofloxacin in d5w] and Penicillins   Review of Systems Review of Systems  HENT: Positive for rhinorrhea and sore throat.   Respiratory: Positive for cough, shortness of breath and wheezing.   Cardiovascular: Negative for chest pain.  All other systems reviewed and are negative.    Physical Exam Updated Vital Signs BP 114/76 (BP Location: Left Arm)   Pulse 88   Temp 99.5 F (37.5 C) (Oral)   Resp 18   Ht 5\' 6"  (1.676 m)   Wt 73.5 kg   SpO2 95%   BMI 26.15 kg/m   Physical Exam  Constitutional: She is oriented to person, place, and time. She appears well-developed and well-nourished.  HENT:  Head: Normocephalic and atraumatic.  Eyes: Conjunctivae and EOM are normal.  Neck: Normal range of motion.  Cardiovascular: Normal rate and regular rhythm.  Pulmonary/Chest: Effort normal. No stridor. No respiratory distress. She has wheezes (LLL).  Abdominal: Soft. Bowel sounds are normal. She exhibits no distension.   Musculoskeletal: Normal range of motion. She exhibits no edema or deformity.  Neurological: She is alert and oriented to person, place, and time.  Skin: Skin is warm and dry.  Nursing note and vitals reviewed.    ED Treatments / Results  Labs (all labs ordered are listed, but only abnormal results are displayed) Labs Reviewed - No data to display  EKG None  Radiology Dg Chest 2 View  Result Date: 06/23/2018 CLINICAL DATA:  Dyspnea and dry cough EXAM: CHEST - 2 VIEW COMPARISON:  06/19/2013 FINDINGS: Low lung volumes with subsegmental atelectasis in the left mid and both lung bases. Mild diffuse interstitial prominence may reflect chronic bronchitic change. No alveolar consolidation, effusion or pneumothorax. No overt pulmonary edema. Slight dextroconvex curvature of the thoracolumbar junction is stable. IMPRESSION: Mild bronchitic change of the lungs is suspected with subsegmental atelectasis at each lung base and left mid lung. Electronically Signed   By: Tollie Ethavid  Kwon M.D.   On: 06/23/2018 20:19    Procedures Procedures (including critical care time)  Medications Ordered in ED Medications  doxycycline (VIBRA-TABS) tablet 100 mg (100 mg Oral Given 06/23/18 2102)  predniSONE (DELTASONE) tablet 60 mg (60 mg Oral Given 06/23/18 2101)  albuterol (PROVENTIL HFA;VENTOLIN HFA) 108 (90 Base) MCG/ACT inhaler 2 puff (2 puffs Inhalation Given 06/23/18 2105)     Initial Impression / Assessment and Plan / ED Course  I have reviewed the triage vital signs and the nursing notes.  Pertinent labs & imaging results that were available during my care of the patient were reviewed by me and considered in my medical decision making (see chart for details).  cxr with atelectasis in same area of asymmetric wheezing, will treat for CAP for same. Stable for discharge.   Final Clinical Impressions(s) / ED Diagnoses   Final diagnoses:  Community acquired pneumonia of left lung, unspecified part of lung     ED Discharge Orders         Ordered    predniSONE (DELTASONE) 20 MG tablet     06/23/18 2056    doxycycline (VIBRAMYCIN) 100 MG capsule  2 times daily     06/23/18 2056           Archimedes Harold, Barbara CowerJason, MD 06/24/18 0020

## 2019-06-26 ENCOUNTER — Other Ambulatory Visit: Payer: Self-pay

## 2019-06-26 DIAGNOSIS — Z20822 Contact with and (suspected) exposure to covid-19: Secondary | ICD-10-CM

## 2019-06-28 LAB — NOVEL CORONAVIRUS, NAA: SARS-CoV-2, NAA: NOT DETECTED

## 2019-12-29 ENCOUNTER — Other Ambulatory Visit: Payer: Self-pay

## 2019-12-29 ENCOUNTER — Emergency Department (HOSPITAL_BASED_OUTPATIENT_CLINIC_OR_DEPARTMENT_OTHER)
Admission: EM | Admit: 2019-12-29 | Discharge: 2019-12-29 | Disposition: A | Discharge status: Home / Self Care | Payer: PRIVATE HEALTH INSURANCE | Attending: Emergency Medicine | Admitting: Emergency Medicine

## 2019-12-29 ENCOUNTER — Encounter (HOSPITAL_BASED_OUTPATIENT_CLINIC_OR_DEPARTMENT_OTHER): Payer: Self-pay | Admitting: Emergency Medicine

## 2019-12-29 DIAGNOSIS — H05223 Edema of bilateral orbit: Secondary | ICD-10-CM | POA: Insufficient documentation

## 2019-12-29 DIAGNOSIS — Z880 Allergy status to penicillin: Secondary | ICD-10-CM | POA: Insufficient documentation

## 2019-12-29 DIAGNOSIS — I10 Essential (primary) hypertension: Secondary | ICD-10-CM | POA: Insufficient documentation

## 2019-12-29 DIAGNOSIS — F17210 Nicotine dependence, cigarettes, uncomplicated: Secondary | ICD-10-CM | POA: Insufficient documentation

## 2019-12-29 DIAGNOSIS — R600 Localized edema: Secondary | ICD-10-CM

## 2019-12-29 DIAGNOSIS — Z7952 Long term (current) use of systemic steroids: Secondary | ICD-10-CM | POA: Insufficient documentation

## 2019-12-29 DIAGNOSIS — R220 Localized swelling, mass and lump, head: Secondary | ICD-10-CM | POA: Insufficient documentation

## 2019-12-29 LAB — URINALYSIS, ROUTINE W REFLEX MICROSCOPIC
Bilirubin Urine: NEGATIVE
Glucose, UA: NEGATIVE mg/dL
Hgb urine dipstick: NEGATIVE
Ketones, ur: NEGATIVE mg/dL
Leukocytes,Ua: NEGATIVE
Nitrite: NEGATIVE
Protein, ur: NEGATIVE mg/dL
Specific Gravity, Urine: 1.02 (ref 1.005–1.030)
pH: 6.5 (ref 5.0–8.0)

## 2019-12-29 LAB — COMPREHENSIVE METABOLIC PANEL
ALT: 17 U/L (ref 0–44)
AST: 16 U/L (ref 15–41)
Albumin: 4 g/dL (ref 3.5–5.0)
Alkaline Phosphatase: 42 U/L (ref 38–126)
Anion gap: 9 (ref 5–15)
BUN: 25 mg/dL — ABNORMAL HIGH (ref 8–23)
CO2: 27 mmol/L (ref 22–32)
Calcium: 9.3 mg/dL (ref 8.9–10.3)
Chloride: 106 mmol/L (ref 98–111)
Creatinine, Ser: 1.23 mg/dL — ABNORMAL HIGH (ref 0.44–1.00)
GFR calc Af Amer: 54 mL/min — ABNORMAL LOW (ref >60)
GFR calc non Af Amer: 47 mL/min — ABNORMAL LOW (ref >60)
Glucose, Bld: 96 mg/dL (ref 70–99)
Potassium: 4.1 mmol/L (ref 3.5–5.1)
Sodium: 142 mmol/L (ref 135–145)
Total Bilirubin: 0.9 mg/dL (ref 0.3–1.2)
Total Protein: 7.2 g/dL (ref 6.5–8.1)

## 2019-12-29 LAB — CBC WITH DIFFERENTIAL/PLATELET
Abs Immature Granulocytes: 0.03 10*3/uL (ref 0.00–0.07)
Basophils Absolute: 0 10*3/uL (ref 0.0–0.1)
Basophils Relative: 1 %
Eosinophils Absolute: 0.7 10*3/uL — ABNORMAL HIGH (ref 0.0–0.5)
Eosinophils Relative: 11 %
HCT: 36.3 % (ref 36.0–46.0)
Hemoglobin: 12.3 g/dL (ref 12.0–15.0)
Immature Granulocytes: 1 %
Lymphocytes Relative: 41 %
Lymphs Abs: 2.5 10*3/uL (ref 0.7–4.0)
MCH: 33.6 pg (ref 26.0–34.0)
MCHC: 33.9 g/dL (ref 30.0–36.0)
MCV: 99.2 fL (ref 80.0–100.0)
Monocytes Absolute: 0.5 10*3/uL (ref 0.1–1.0)
Monocytes Relative: 8 %
Neutro Abs: 2.3 10*3/uL (ref 1.7–7.7)
Neutrophils Relative %: 38 %
Platelets: 234 10*3/uL (ref 150–400)
RBC: 3.66 MIL/uL — ABNORMAL LOW (ref 3.87–5.11)
RDW: 15.7 % — ABNORMAL HIGH (ref 11.5–15.5)
WBC: 6 10*3/uL (ref 4.0–10.5)
nRBC: 0 % (ref 0.0–0.2)

## 2019-12-29 MED ORDER — PREDNISONE 20 MG PO TABS: ORAL_TABLET | ORAL | 0 refills | Status: DC

## 2019-12-29 MED ORDER — PREDNISONE 20 MG PO TABS
ORAL_TABLET | ORAL | 0 refills | Status: DC
Start: 2019-12-29 — End: 2019-12-29

## 2019-12-29 MED ORDER — DOXYCYCLINE HYCLATE 100 MG PO CAPS: 100.0000 mg | ORAL_CAPSULE | Freq: Two times a day (BID) | ORAL | 0 refills | Status: DC

## 2019-12-29 NOTE — ED Triage Notes (Signed)
PT presents with c/o both eyes swollen and itching since she woke up this. Pt states she went the doctor last week for eyes swelling and he gave her something for allergies

## 2019-12-29 NOTE — ED Provider Notes (Signed)
[facility] EMERGENCY DEPARTMENT Provider Note   CSN: [id number] Arrival date & time: 12/29/19  1033     History Chief Complaint  Patient presents with  . Eye Problem    Kelly Callahan is a 64 y.o. female.  Patient presents with approximately 1 week history of swelling around her eyes.  Patient has had some itching and minimal drainage.  It is difficult to open her eyes because of the swelling.  No eye pain.  No fevers.  She saw her primary care doctor who prescribed 5 days of prednisone, Pepcid, hydroxyzine, hydrocortisone cream.  She denies new medications.  She denies soap or skin exposures around her eyes.  She has not had any extensive swelling of her legs.  No shortness of breath or cough.  No history of heart failure or kidney problems.  She reports frequent urination.  No skin rash, syncope, N/V/D, abd pain.         Past Medical History:  Diagnosis Date  . Cellulitis 05/2013   RT LEG SPIDER BITE  . GERD (gastroesophageal reflux disease)   . Hypertension    PATIENT STATES SHE DOES NOT FEEL SHE HAS HTN   . Pancreatitis     Patient Active Problem List   Diagnosis Date Noted  . Thyroid enlargement 01/09/2014  . Abnormal TSH 01/02/2014  . Abnormal CT scan, pancreas or bile duct  S/P bx 12/2013  [facility] 11/15/2013  . Abnormal mammogram  see mm 10/2013 11/15/2013  . Pancreatitis 11/08/2013  . GERD (gastroesophageal reflux disease) 11/08/2013  . FH: ovarian cancer  mother 11/08/2013  . Tobacco use disorder 11/08/2013  . Cholelithiasis 11/08/2013  . Calcification of lung 07/03/2013  . Inguinal adenopathy 07/03/2013  . 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 Essential hypertension, benign 06/29/2013  . Cellulitis of right lower extremity 06/18/2013    Past Surgical History:  Procedure Laterality Date  . CESAREAN SECTION       OB History    Gravida  5   Para      Term      Preterm      AB  1   Living  4     SAB      TAB      Ectopic       Multiple      Live Births              Family History  Problem Relation Age of Onset  . Cancer Mother 43       ovarian  . Hypertension Mother   . Cancer Father        sarcoma  . Hypertension Sister   . Hypertension Brother     Social History   Tobacco Use  . Smoking status: Current Every Day Smoker    Packs/day: 0.50    Years: 30.00    Pack years: 15.00    Types: Cigarettes  . Smokeless tobacco: Never Used  Vaping Use  . Vaping Use: Never used  Substance Use Topics  . Alcohol use: No  . Drug use: No    Home Medications Prior to Admission medications   Medication Sig Start Date End Date Taking? Authorizing Provider  doxycycline (VIBRAMYCIN) 100 MG capsule Take 1 capsule (100 mg total) by mouth 2 (two) times daily. One po bid x 7 days 06/23/18   Mesner, Barbara Cower, MD  hydrochlorothiazide (HYDRODIURIL) 25 MG tablet Take 1 tablet (25 mg total) by mouth daily. 11/15/13   Schoenhoff, Harrington Challenger, MD  omeprazole (PRILOSEC) 40 MG capsule Take 40 mg by mouth daily.    [provider]  oxyCODONE-acetaminophen (PERCOCET/ROXICET) 5-325 MG per tablet  10/21/13   [provider]  predniSONE (DELTASONE) 20 MG tablet 2 tabs po daily x 4 days 06/23/18   Mesner, Corene Cornea, MD    Allergies    Levaquin [levofloxacin in d5w] and Penicillins  Review of Systems   Review of Systems  Constitutional: Negative for fever.  HENT: Positive for facial swelling. Negative for trouble swallowing.   Eyes: Negative for redness.  Respiratory: Negative for shortness of breath, wheezing and stridor.   Cardiovascular: Negative for chest pain.  Gastrointestinal: Negative for abdominal pain, diarrhea, nausea and vomiting.  Musculoskeletal: Negative for myalgias.  Skin: Negative for rash.  Neurological: Negative for light-headedness.  Psychiatric/Behavioral: Negative for confusion.    Physical Exam Updated Vital Signs BP 124/70 (BP Location: Right Arm)   Pulse (!) 49   Temp 98.4 F (36.9  C) (Oral)   Resp 14   SpO2 100%   Physical Exam Vitals and nursing note reviewed.  Constitutional:      Appearance: She is well-developed.  HENT:     Head: Normocephalic and atraumatic.     Comments: Patient with extensive periorbital edema bilaterally without significant overlying erythema or signs of cellulitis. Eyes:     General:        Right eye: No discharge.        Left eye: No discharge.     Conjunctiva/sclera: Conjunctivae normal.     Comments: Gross vision intact.  Sclera are not significantly injected.  Full range of motion of the eyes.  Pupils are reactive.  No hyphema.  Cardiovascular:     Rate and Rhythm: Normal rate and regular rhythm.     Heart sounds: Normal heart sounds.  Pulmonary:     Effort: Pulmonary effort is normal.     Breath sounds: Normal breath sounds.  Abdominal:     Palpations: Abdomen is soft.     Tenderness: There is no abdominal tenderness. There is no guarding or rebound.  Musculoskeletal:     Cervical back: Normal range of motion and neck supple.     Right lower leg: No edema.     Left lower leg: No edema.  Skin:    General: Skin is warm and dry.  Neurological:     Mental Status: She is alert.     ED Results / Procedures / Treatments   Labs (all labs ordered are listed, but only abnormal results are displayed) Labs Reviewed  CBC WITH DIFFERENTIAL/PLATELET - Abnormal; Notable for the following components:      Result Value   RBC 3.66 (*)    RDW 15.7 (*)    Eosinophils Absolute 0.7 (*)    All other components within normal limits  COMPREHENSIVE METABOLIC PANEL - Abnormal; Notable for the following components:   BUN 25 (*)    Creatinine, Ser 1.23 (*)    GFR calc non Af Amer 47 (*)    GFR calc Af Amer 54 (*)    All other components within normal limits  URINALYSIS, ROUTINE W REFLEX MICROSCOPIC    EKG None  Radiology No results found.  Procedures Procedures (including critical care time)  Medications Ordered in  ED Medications - No data to display  ED Course  I have reviewed the triage vital signs and the nursing notes.  Pertinent labs & imaging results that were available during my care of the patient  were reviewed by me and considered in my medical decision making (see chart for details).  Patient seen and examined.  Patient with significant periorbital edema.  Will check labs to ensure no signs of nephrotic syndrome, complication from DM -- especially in light of no significant improvement with appropriate treatment for allergic reaction.  I have low concern for periorbital cellulitis given her exam.  Vital signs reviewed and are as follows: BP 124/70 (BP Location: Right Arm)   Pulse (!) 49   Temp 98.4 F (36.9 C) (Oral)   Resp 14   SpO2 100%   3:39 PM lab work is reassuring.  Mild elevated eosinophils noted.  No signs of liver dysfunction or nephrotic syndrome.  Patient will be placed on a tapered course of prednisone given prescription for doxycycline.  We discussed antibiotics may be of limited utility, however given minimal improvement with treatment of allergic reaction, I gave the patient an option as to whether she wanted to try this or not.  She would like to try.  Encourage follow-up with her PCP early next week for recheck.  Discussed return to the emergency department with worsening pain, redness around the eyes, worsening swelling, fever, or if she has any other concerns.    Visual Acuity  Right Eye Distance: 20/50 Left Eye Distance: 20/70 Bilateral Distance: 20/50  Right Eye Near:   Left Eye Near:    Bilateral Near:        MDM Rules/Calculators/A&P                          Patient with extensive periorbital edema without significant signs of infection.  Likely allergic, however she has had minimal improvement on prednisone, histamine blockade, and topical hydrocortisone.  Will give taper course of prednisone as symptoms seem to get a little worse after finishing her  burst.  Will also give empiric doxycycline to treat possible mild infection.  I have low concern for significant preorbital cellulitis or orbital cellulitis at this point.  Not feel patient requires advanced imaging at this time.  Her globes appear normal.   Final Clinical Impression(s) / ED Diagnoses Final diagnoses:  Periorbital edema of both eyes    Rx / DC Orders ED Discharge Orders         Ordered    doxycycline (VIBRAMYCIN) 100 MG capsule  2 times daily     Discontinue  Reprint     12/29/19 1538    predniSONE (DELTASONE) 20 MG tablet     Discontinue  Reprint     12/29/19 1538           Renne Crigler, PA-C 12/29/19 1541    Pricilla Loveless, MD 12/29/19 1747

## 2019-12-29 NOTE — Discharge Instructions (Signed)
Please read and follow all provided instructions.  Your diagnoses today include:  1. Periorbital edema of both eyes     Tests performed today include:  Vital signs. See below for your results today.   Medications prescribed:   Prednisone - steroid medicine   It is best to take this medication in the morning to prevent sleeping problems. If you are diabetic, monitor your blood sugar closely and stop taking Prednisone if blood sugar is over 300. Take with food to prevent stomach upset.    Doxycycline - antibiotic  You have been prescribed an antibiotic medicine: take the entire course of medicine even if you are feeling better. Stopping early can cause the antibiotic not to work.  Take any prescribed medications only as directed.  Home care instructions:   Follow any educational materials contained in this packet  Follow-up instructions: Please follow-up with your primary care provider in the next 3 days for further evaluation of your symptoms.   Return instructions:   Please return to the Emergency Department if you experience worsening symptoms.   Call 9-1-1 immediately if you have an allergic reaction that involves your lips, mouth, throat or if you have any difficulty breathing. This is a life-threatening emergency.   Please return if you have any other emergent concerns.  Additional Information:  Your vital signs today were: BP 124/70 (BP Location: Right Arm)   Pulse (!) 49   Temp 98.4 F (36.9 C) (Oral)   Resp 14   SpO2 100%  If your blood pressure (BP) was elevated above 135/85 this visit, please have this repeated by your doctor within one month. --------------

## 2021-11-28 ENCOUNTER — Encounter (HOSPITAL_BASED_OUTPATIENT_CLINIC_OR_DEPARTMENT_OTHER): Payer: Self-pay

## 2021-11-28 ENCOUNTER — Other Ambulatory Visit: Payer: Self-pay

## 2021-11-28 ENCOUNTER — Inpatient Hospital Stay (HOSPITAL_BASED_OUTPATIENT_CLINIC_OR_DEPARTMENT_OTHER)
Admission: EM | Admit: 2021-11-28 | Discharge: 2021-11-30 | DRG: 177 | Disposition: A | Discharge status: Home / Self Care | Payer: BC Managed Care – PPO | Attending: Internal Medicine | Admitting: Internal Medicine

## 2021-11-28 ENCOUNTER — Emergency Department (HOSPITAL_BASED_OUTPATIENT_CLINIC_OR_DEPARTMENT_OTHER): Payer: BC Managed Care – PPO

## 2021-11-28 DIAGNOSIS — N183 Chronic kidney disease, stage 3 unspecified: Secondary | ICD-10-CM | POA: Diagnosis present

## 2021-11-28 DIAGNOSIS — K219 Gastro-esophageal reflux disease without esophagitis: Secondary | ICD-10-CM | POA: Diagnosis not present

## 2021-11-28 DIAGNOSIS — I129 Hypertensive chronic kidney disease with stage 1 through stage 4 chronic kidney disease, or unspecified chronic kidney disease: Secondary | ICD-10-CM | POA: Diagnosis present

## 2021-11-28 DIAGNOSIS — Z87891 Personal history of nicotine dependence: Secondary | ICD-10-CM | POA: Diagnosis not present

## 2021-11-28 DIAGNOSIS — J9601 Acute respiratory failure with hypoxia: Principal | ICD-10-CM | POA: Diagnosis present

## 2021-11-28 DIAGNOSIS — Z79899 Other long term (current) drug therapy: Secondary | ICD-10-CM | POA: Diagnosis not present

## 2021-11-28 DIAGNOSIS — U071 COVID-19: Principal | ICD-10-CM | POA: Diagnosis present

## 2021-11-28 DIAGNOSIS — Z8249 Family history of ischemic heart disease and other diseases of the circulatory system: Secondary | ICD-10-CM

## 2021-11-28 DIAGNOSIS — Z28310 Unvaccinated for covid-19: Secondary | ICD-10-CM

## 2021-11-28 DIAGNOSIS — Z880 Allergy status to penicillin: Secondary | ICD-10-CM

## 2021-11-28 DIAGNOSIS — Z881 Allergy status to other antibiotic agents status: Secondary | ICD-10-CM

## 2021-11-28 DIAGNOSIS — I10 Essential (primary) hypertension: Secondary | ICD-10-CM | POA: Diagnosis present

## 2021-11-28 HISTORY — DX: Disorder of kidney and ureter, unspecified: N28.9

## 2021-11-28 LAB — COMPREHENSIVE METABOLIC PANEL
ALT: 24 U/L (ref 0–44)
AST: 31 U/L (ref 15–41)
Albumin: 4 g/dL (ref 3.5–5.0)
Alkaline Phosphatase: 46 U/L (ref 38–126)
Anion gap: 7 (ref 5–15)
BUN: 25 mg/dL — ABNORMAL HIGH (ref 8–23)
CO2: 22 mmol/L (ref 22–32)
Calcium: 9.2 mg/dL (ref 8.9–10.3)
Chloride: 108 mmol/L (ref 98–111)
Creatinine, Ser: 1.34 mg/dL — ABNORMAL HIGH (ref 0.44–1.00)
GFR, Estimated: 44 mL/min — ABNORMAL LOW (ref >60)
Glucose, Bld: 112 mg/dL — ABNORMAL HIGH (ref 70–99)
Potassium: 3.8 mmol/L (ref 3.5–5.1)
Sodium: 137 mmol/L (ref 135–145)
Total Bilirubin: 0.6 mg/dL (ref 0.3–1.2)
Total Protein: 7.3 g/dL (ref 6.5–8.1)

## 2021-11-28 LAB — CBC WITH DIFFERENTIAL/PLATELET
Abs Immature Granulocytes: 0.06 10*3/uL (ref 0.00–0.07)
Basophils Absolute: 0 10*3/uL (ref 0.0–0.1)
Basophils Relative: 0 %
Eosinophils Absolute: 0.1 10*3/uL (ref 0.0–0.5)
Eosinophils Relative: 1 %
HCT: 33.7 % — ABNORMAL LOW (ref 36.0–46.0)
Hemoglobin: 11.5 g/dL — ABNORMAL LOW (ref 12.0–15.0)
Immature Granulocytes: 1 %
Lymphocytes Relative: 6 %
Lymphs Abs: 0.5 10*3/uL — ABNORMAL LOW (ref 0.7–4.0)
MCH: 32.9 pg (ref 26.0–34.0)
MCHC: 34.1 g/dL (ref 30.0–36.0)
MCV: 96.3 fL (ref 80.0–100.0)
Monocytes Absolute: 1 10*3/uL (ref 0.1–1.0)
Monocytes Relative: 13 %
Neutro Abs: 6.2 10*3/uL (ref 1.7–7.7)
Neutrophils Relative %: 79 %
Platelets: 164 10*3/uL (ref 150–400)
RBC: 3.5 MIL/uL — ABNORMAL LOW (ref 3.87–5.11)
RDW: 14.6 % (ref 11.5–15.5)
WBC: 7.9 10*3/uL (ref 4.0–10.5)
nRBC: 0 % (ref 0.0–0.2)

## 2021-11-28 LAB — URINALYSIS, ROUTINE W REFLEX MICROSCOPIC
Bilirubin Urine: NEGATIVE
Glucose, UA: NEGATIVE mg/dL
Hgb urine dipstick: NEGATIVE
Ketones, ur: NEGATIVE mg/dL
Leukocytes,Ua: NEGATIVE
Nitrite: NEGATIVE
Protein, ur: NEGATIVE mg/dL
Specific Gravity, Urine: 1.02 (ref 1.005–1.030)
pH: 5 (ref 5.0–8.0)

## 2021-11-28 LAB — LACTIC ACID, PLASMA: Lactic Acid, Venous: 0.7 mmol/L (ref 0.5–1.9)

## 2021-11-28 LAB — TROPONIN I (HIGH SENSITIVITY)
Troponin I (High Sensitivity): 8 ng/L (ref <18)
Troponin I (High Sensitivity): 10 ng/L (ref <18)

## 2021-11-28 LAB — BRAIN NATRIURETIC PEPTIDE: B Natriuretic Peptide: 20.9 pg/mL (ref 0.0–100.0)

## 2021-11-28 LAB — RESP PANEL BY RT-PCR (FLU A&B, COVID) ARPGX2
Influenza A by PCR: NEGATIVE
Influenza B by PCR: NEGATIVE
SARS Coronavirus 2 by RT PCR: POSITIVE — AB

## 2021-11-28 LAB — PROTIME-INR
INR: 1 (ref 0.8–1.2)
Prothrombin Time: 13.4 seconds (ref 11.4–15.2)

## 2021-11-28 LAB — LIPASE, BLOOD: Lipase: 36 U/L (ref 11–51)

## 2021-11-28 LAB — APTT: aPTT: 33 seconds (ref 24–36)

## 2021-11-28 MED ORDER — ACETAMINOPHEN 500 MG PO TABS
1000.0000 mg | ORAL_TABLET | Freq: Once | ORAL | Status: AC
  Administered 2021-11-28: 1000 mg via ORAL

## 2021-11-28 MED ORDER — IBUPROFEN 400 MG PO TABS: 600.0000 mg | ORAL_TABLET | Freq: Once | ORAL | Status: DC

## 2021-11-28 MED ORDER — SODIUM CHLORIDE 0.9 % IV BOLUS
1000.0000 mL | Freq: Once | INTRAVENOUS | Status: AC
  Administered 2021-11-28: 1000 mL via INTRAVENOUS

## 2021-11-28 MED ORDER — ACETAMINOPHEN 500 MG PO TABS
ORAL_TABLET | ORAL | Status: AC
  Filled 2021-11-28: qty 2

## 2021-11-28 MED ORDER — NIRMATRELVIR/RITONAVIR (PAXLOVID) TABLET (RENAL DOSING)
2.0000 | ORAL_TABLET | Freq: Two times a day (BID) | ORAL | Status: DC
  Filled 2021-11-28: qty 20

## 2021-11-28 NOTE — ED Provider Notes (Signed)
? ?Emergency Department Provider Note ? ? ?I have reviewed the triage vital signs and the nursing notes. ? ? ?HISTORY ? ?Chief Complaint ?Fever ? ? ?HPI ?Kelly Callahan is a 66 y.o. female with past medical history reviewed below presents to the emergency department with fever along with headache, chills, cough, urine frequency.  Patient describes symptoms beginning mainly this morning.  She has diffuse body aches as well as some urine frequency but no dysuria.  No vomiting or diarrhea.  She is not having pain or discomfort in her abdomen or chest.  She describes an associated headache which was not acute onset/maximal intensity.  She notes that 3 other family members are sick with similar symptoms at this time. ? ? ?Past Medical History:  ?Diagnosis Date  ? Cellulitis 05/20/2013  ? RT LEG SPIDER BITE  ? GERD (gastroesophageal reflux disease)   ? Hypertension   ? PATIENT STATES SHE DOES NOT FEEL SHE HAS HTN   ? Kidney disease   ? Pancreatitis   ? ? ?Review of Systems ? ?Constitutional: Positive fever/chills ?Eyes: No visual changes. ?ENT: Mild sore throat and congestion.  ?Cardiovascular: Denies chest pain. ?Respiratory: Mild shortness of breath and cough.  ?Gastrointestinal: No abdominal pain.  No nausea, no vomiting.  No diarrhea.  No constipation. ?Genitourinary: Negative for dysuria. Positive urine frequency.  ?Musculoskeletal: Negative for back pain. ?Skin: Negative for rash. ?Neurological: Negative for focal weakness or numbness. Positive HA.  ? ? ?____________________________________________ ? ? ?PHYSICAL EXAM: ? ?VITAL SIGNS: ?ED Triage Vitals  ?Enc Vitals Group  ?   BP 11/28/21 1812 128/78  ?   Pulse Rate 11/28/21 1812 (!) 136  ?   Resp 11/28/21 1812 17  ?   Temp 11/28/21 1812 (!) 103.2 ?F (39.6 ?C)  ?   Temp Source 11/28/21 1812 Oral  ?   SpO2 11/28/21 1812 92 %  ?   Weight 11/28/21 1811 186 lb (84.4 kg)  ?   Height 11/28/21 1811 5' 5.75" (1.67 m)  ? ?Constitutional: Alert and oriented. Well appearing and  in no acute distress. ?Eyes: Conjunctivae are normal. ?Head: Atraumatic. ?Nose: No congestion/rhinnorhea. ?Mouth/Throat: Mucous membranes are moist. ?Neck: No stridor.  No meningeal signs. ?Cardiovascular: Tachycardia. Good peripheral circulation. Grossly normal heart sounds.   ?Respiratory: Normal respiratory effort.  No retractions. Lungs CTAB. ?Gastrointestinal: Soft and nontender. No distention.  ?Musculoskeletal: No lower extremity tenderness nor edema. No gross deformities of extremities. ?Neurologic:  Normal speech and language. No gross focal neurologic deficits are appreciated.  ?Skin:  Skin is warm, dry and intact. No rash noted. ? ?____________________________________________ ?  ?LABS ?(all labs ordered are listed, but only abnormal results are displayed) ? ?Labs Reviewed  ?RESP PANEL BY RT-PCR (FLU A&B, COVID) ARPGX2 - Abnormal; Notable for the following components:  ?    Result Value  ? SARS Coronavirus 2 by RT PCR POSITIVE (*)   ? All other components within normal limits  ?URINE CULTURE - Abnormal; Notable for the following components:  ? Culture   (*)   ? Value: <10,000 COLONIES/mL INSIGNIFICANT GROWTH ?Performed at [facility], [address] [place], [place] [address] ?  ? All other components within normal limits  ?COMPREHENSIVE METABOLIC PANEL - Abnormal; Notable for the following components:  ? Glucose, Bld 112 (*)   ? BUN 25 (*)   ? Creatinine, Ser 1.34 (*)   ? GFR, Estimated 44 (*)   ? All other components within normal limits  ?CBC WITH DIFFERENTIAL/PLATELET -  Abnormal; Notable for the following components:  ? RBC 3.50 (*)   ? Hemoglobin 11.5 (*)   ? HCT 33.7 (*)   ? Lymphs Abs 0.5 (*)   ? All other components within normal limits  ?D-DIMER, QUANTITATIVE - Abnormal; Notable for the following components:  ? D-Dimer, Quant 0.96 (*)   ? All other components within normal limits  ?C-REACTIVE PROTEIN - Abnormal; Notable for the following components:  ? CRP 1.4 (*)   ? All other components  within normal limits  ?CBC WITH DIFFERENTIAL/PLATELET - Abnormal; Notable for the following components:  ? WBC 3.2 (*)   ? RBC 3.72 (*)   ? Platelets 133 (*)   ? Neutro Abs 1.4 (*)   ? All other components within normal limits  ?COMPREHENSIVE METABOLIC PANEL - Abnormal; Notable for the following components:  ? Glucose, Bld 112 (*)   ? Creatinine, Ser 1.32 (*)   ? Albumin 3.4 (*)   ? GFR, Estimated 45 (*)   ? All other components within normal limits  ?CULTURE, BLOOD (ROUTINE X 2)  ?CULTURE, BLOOD (ROUTINE X 2)  ?LACTIC ACID, PLASMA  ?PROTIME-INR  ?APTT  ?URINALYSIS, ROUTINE W REFLEX MICROSCOPIC  ?LIPASE, BLOOD  ?BRAIN NATRIURETIC PEPTIDE  ?HIV ANTIBODY (ROUTINE TESTING W REFLEX)  ?FIBRINOGEN  ?FERRITIN  ?TROPONIN I (HIGH SENSITIVITY)  ?TROPONIN I (HIGH SENSITIVITY)  ? ?____________________________________________ ? ?EKG ? ? EKG Interpretation ? ?Date/Time:  Friday Nov 28 2021 18:23:50 EDT ?Ventricular Rate:  127 ?PR Interval:  150 ?QRS Duration: 96 ?QT Interval:  319 ?QTC Calculation: 464 ?R Axis:   30 ?Text Interpretation: Sinus tachycardia Borderline T abnormalities, inferior leads Confirmed by Alona BeneLong, Perle Brickhouse [id number]) on 11/28/2021 6:26:14 PM ?  ? ?  ? ? ?____________________________________________ ? ? ?PROCEDURES ? ?Procedure(s) performed:  ? ?Procedures ? ?CRITICAL CARE ?Performed by: Maia PlanJoshua G Carnella Fryman ?Total critical care time: 35 minutes ?Critical care time was exclusive of separately billable procedures and treating other patients. ?Critical care was necessary to treat or prevent imminent or life-threatening deterioration. ?Critical care was time spent personally by me on the following activities: development of treatment plan with patient and/or surrogate as well as nursing, discussions with consultants, evaluation of patient's response to treatment, examination of patient, obtaining history from patient or surrogate, ordering and performing treatments and interventions, ordering and review of laboratory studies,  ordering and review of radiographic studies, pulse oximetry and re-evaluation of patient's condition. ? ?Alona BeneJoshua Darrow Barreiro, MD ?Emergency Medicine ? ?____________________________________________ ? ? ?INITIAL IMPRESSION / ASSESSMENT AND PLAN / ED COURSE ? ?Pertinent labs & imaging results that were available during my care of the patient were reviewed by me and considered in my medical decision making (see chart for details). ?  ?This patient is Presenting for Evaluation of fever, which does require a range of treatment options, and is a complaint that involves a high risk of morbidity and mortality. ? ?The Differential Diagnoses include developing sepsis, COVID, Flu, CAP, UTI. ? ?Critical Interventions-  ?  ?Medications  ?sodium chloride 0.9 % bolus 1,000 mL (0 mLs Intravenous Stopped 11/28/21 2100)  ?acetaminophen (TYLENOL) tablet 1,000 mg ( Oral Not Given 11/28/21 1927)  ?prochlorperazine (COMPAZINE) injection 5 mg (5 mg Intravenous Given 11/29/21 0319)  ? ? ?Reassessment after intervention:  No change in respiratory status.  ? ? ?I did obtain Additional Historical Information from daughter at bedside. ? ?I decided to review pertinent External Data, and in summary last ED visit was 2021 for an unrelated issue. ?  ?Clinical  Laboratory Tests Ordered, included troponin is negative. COVID positive. No UTI. No AKI. No severe anemia.  ? ?Radiologic Tests Ordered, included CXR. I independently interpreted the images and agree with radiology interpretation.  ? ?Cardiac Monitor Tracing which shows sinus tachycardia. ? ? ?Social Determinants of Health Risk patient with a prior smoking history.  ? ?Consult complete with [facility], Plan for admit.  ? ?Medical Decision Making: Summary:  ?Patient arrives emergency department with high fever, tachycardia, mild hypoxemia.  Symptoms are nonspecific.  Initial sepsis work-up initiated along with viral PCR testing and IV fluids/Tylenol.  No altered mental status. ? ?Reevaluation with update and  discussion with patient and family. Will begin Paxlovid and admit with acute respiratory failure 2/2 COVID.  ? ?Disposition: admit ? ?____________________________________________ ? ?FINAL CLINICAL IMPRES

## 2021-11-28 NOTE — ED Notes (Signed)
BLOOD CULTURES X 2 OBTAINED (#1 RT AC / #2 RT HAND) ?

## 2021-11-28 NOTE — Plan of Care (Signed)
[facility] will assume care on arrival to accepting facility. Until arrival, care as per EDP. However, [facility] available 24/7 for questions and assistance.  Nursing staff, please page [facility] Admits and Consults ([phone]) as soon as the patient arrives to the hospital.   

## 2021-11-28 NOTE — ED Triage Notes (Signed)
C/o chills, headache, fatigue, fever since this morning. Took tylenol at 1000. States 3 family members sick with similar symptoms.  ?

## 2021-11-28 NOTE — ED Notes (Signed)
Awoke this am with HA, developed fever, took tylenol early this am. Presents now with whole body aches.  ?

## 2021-11-28 NOTE — ED Notes (Signed)
Patient Medication not available at the this location . Told by pharmacy tech Windell Moulding medication will be able to be given , when the patient arrives to the main [facility] she will be given the medications .  ?

## 2021-11-28 NOTE — ED Notes (Signed)
Patient has a headache and is febrile MD Made aware .  ?

## 2021-11-29 DIAGNOSIS — Z8249 Family history of ischemic heart disease and other diseases of the circulatory system: Secondary | ICD-10-CM | POA: Diagnosis not present

## 2021-11-29 DIAGNOSIS — I129 Hypertensive chronic kidney disease with stage 1 through stage 4 chronic kidney disease, or unspecified chronic kidney disease: Secondary | ICD-10-CM | POA: Diagnosis present

## 2021-11-29 DIAGNOSIS — U071 COVID-19: Secondary | ICD-10-CM

## 2021-11-29 DIAGNOSIS — Z880 Allergy status to penicillin: Secondary | ICD-10-CM | POA: Diagnosis not present

## 2021-11-29 DIAGNOSIS — N183 Chronic kidney disease, stage 3 unspecified: Secondary | ICD-10-CM | POA: Diagnosis present

## 2021-11-29 DIAGNOSIS — Z79899 Other long term (current) drug therapy: Secondary | ICD-10-CM | POA: Diagnosis not present

## 2021-11-29 DIAGNOSIS — J9601 Acute respiratory failure with hypoxia: Secondary | ICD-10-CM | POA: Diagnosis present

## 2021-11-29 DIAGNOSIS — Z881 Allergy status to other antibiotic agents status: Secondary | ICD-10-CM | POA: Diagnosis not present

## 2021-11-29 DIAGNOSIS — K219 Gastro-esophageal reflux disease without esophagitis: Secondary | ICD-10-CM | POA: Diagnosis present

## 2021-11-29 DIAGNOSIS — Z87891 Personal history of nicotine dependence: Secondary | ICD-10-CM | POA: Diagnosis not present

## 2021-11-29 DIAGNOSIS — Z28310 Unvaccinated for covid-19: Secondary | ICD-10-CM | POA: Diagnosis not present

## 2021-11-29 LAB — FIBRINOGEN: Fibrinogen: 445 mg/dL (ref 210–475)

## 2021-11-29 LAB — D-DIMER, QUANTITATIVE: D-Dimer, Quant: 0.96 ug/mL-FEU — ABNORMAL HIGH (ref 0.00–0.50)

## 2021-11-29 LAB — FERRITIN: Ferritin: 158 ng/mL (ref 11–307)

## 2021-11-29 LAB — HIV ANTIBODY (ROUTINE TESTING W REFLEX): HIV Screen 4th Generation wRfx: NONREACTIVE

## 2021-11-29 LAB — C-REACTIVE PROTEIN: CRP: 1.4 mg/dL — ABNORMAL HIGH (ref <1.0)

## 2021-11-29 MED ORDER — HYDROCOD POLI-CHLORPHE POLI ER 10-8 MG/5ML PO SUER: 5.0000 mL | Freq: Two times a day (BID) | ORAL | Status: DC | PRN

## 2021-11-29 MED ORDER — ALBUTEROL SULFATE HFA 108 (90 BASE) MCG/ACT IN AERS
2.0000 | INHALATION_SPRAY | Freq: Four times a day (QID) | RESPIRATORY_TRACT | Status: DC | PRN
  Filled 2021-11-29: qty 6.7

## 2021-11-29 MED ORDER — ZINC SULFATE 220 (50 ZN) MG PO CAPS
220.0000 mg | ORAL_CAPSULE | Freq: Every day | ORAL | Status: DC
  Administered 2021-11-29 – 2021-11-30 (×2): 220 mg via ORAL
  Filled 2021-11-29 (×2): qty 1

## 2021-11-29 MED ORDER — ASCORBIC ACID 500 MG PO TABS
500.0000 mg | ORAL_TABLET | Freq: Every day | ORAL | Status: DC
  Administered 2021-11-29 – 2021-11-30 (×2): 500 mg via ORAL
  Filled 2021-11-29 (×2): qty 1

## 2021-11-29 MED ORDER — LOSARTAN POTASSIUM 50 MG PO TABS: 25.0000 mg | ORAL_TABLET | Freq: Every day | ORAL | Status: DC

## 2021-11-29 MED ORDER — ONDANSETRON HCL 4 MG/2ML IJ SOLN: 4.0000 mg | Freq: Four times a day (QID) | INTRAMUSCULAR | Status: DC | PRN

## 2021-11-29 MED ORDER — PROCHLORPERAZINE EDISYLATE 10 MG/2ML IJ SOLN: 5.0000 mg | Freq: Four times a day (QID) | INTRAMUSCULAR | Status: DC | PRN

## 2021-11-29 MED ORDER — ONDANSETRON HCL 4 MG PO TABS: 4.0000 mg | ORAL_TABLET | Freq: Four times a day (QID) | ORAL | Status: DC | PRN

## 2021-11-29 MED ORDER — SODIUM CHLORIDE 0.9% FLUSH: 3.0000 mL | INTRAVENOUS | Status: DC | PRN

## 2021-11-29 MED ORDER — NIRMATRELVIR/RITONAVIR (PAXLOVID) TABLET (RENAL DOSING)
2.0000 | ORAL_TABLET | Freq: Two times a day (BID) | ORAL | Status: DC
Start: 2021-11-29 — End: 2021-11-30
  Administered 2021-11-29 – 2021-11-30 (×3): 2 via ORAL
  Filled 2021-11-29: qty 20

## 2021-11-29 MED ORDER — PROCHLORPERAZINE EDISYLATE 10 MG/2ML IJ SOLN
5.0000 mg | Freq: Once | INTRAMUSCULAR | Status: AC
  Administered 2021-11-29: 5 mg via INTRAVENOUS
  Filled 2021-11-29: qty 2

## 2021-11-29 MED ORDER — SODIUM CHLORIDE 0.9 % IV SOLN: 250.0000 mL | INTRAVENOUS | Status: DC | PRN

## 2021-11-29 MED ORDER — ROSUVASTATIN CALCIUM 5 MG PO TABS
10.0000 mg | ORAL_TABLET | Freq: Every day | ORAL | Status: DC
  Administered 2021-11-29: 10 mg via ORAL
  Filled 2021-11-29: qty 2

## 2021-11-29 MED ORDER — FLUTICASONE PROPIONATE 50 MCG/ACT NA SUSP
2.0000 | Freq: Every day | NASAL | Status: DC
  Administered 2021-11-30: 2 via NASAL
  Filled 2021-11-29: qty 16

## 2021-11-29 MED ORDER — SODIUM CHLORIDE 0.9% FLUSH
3.0000 mL | Freq: Two times a day (BID) | INTRAVENOUS | Status: DC
  Administered 2021-11-29: 3 mL via INTRAVENOUS

## 2021-11-29 MED ORDER — GUAIFENESIN-DM 100-10 MG/5ML PO SYRP
10.0000 mL | ORAL_SOLUTION | ORAL | Status: DC | PRN
  Administered 2021-11-30: 10 mL via ORAL
  Filled 2021-11-29: qty 10

## 2021-11-29 MED ORDER — ACETAMINOPHEN 500 MG PO TABS
1000.0000 mg | ORAL_TABLET | Freq: Three times a day (TID) | ORAL | Status: DC | PRN
  Administered 2021-11-29 (×2): 1000 mg via ORAL
  Filled 2021-11-29 (×2): qty 2

## 2021-11-29 MED ORDER — HYDROCORTISONE 0.5 % EX CREA
TOPICAL_CREAM | Freq: Four times a day (QID) | CUTANEOUS | Status: DC | PRN
  Filled 2021-11-29: qty 28.35

## 2021-11-29 MED ORDER — ENOXAPARIN SODIUM 40 MG/0.4ML IJ SOSY
40.0000 mg | PREFILLED_SYRINGE | INTRAMUSCULAR | Status: DC
  Administered 2021-11-29: 40 mg via SUBCUTANEOUS
  Filled 2021-11-29: qty 0.4

## 2021-11-29 NOTE — H&P (Signed)
?History and Physical  ? ? ?Patient: Kelly Callahan [id number] DOB: Jan 06, 1956 ?DOA: 11/28/2021 ?DOS: the patient was seen and examined on 11/29/2021 ?PCP: Rudene Anda, MD  ?Patient coming from:  [facility]  - lives with her daughter and son in law. Fully independent  ? ? ?Chief Complaint: high temperature/body aches/headahce ? ?HPI: Kelly Callahan is a 66 y.o. female with medical history significant of HTN, GERD, CKD stage 3 who presented to ED with complaints of  fever, headache, cough and chills and urinary frequency. Symptoms began this AM. She also has diffuse body aches. She states her temperature was 105 at home with shortness of breath so they came to ED. She also had some shortness of breath. 3 people in home have been sick with similar symptoms. She took some tylenol at home. She is feeling better after her time in ED. Her headache is much improved and her body aches are resolved. She has a mild cough which she attributes to her post nasal drip. Not productive. She also denies any shortness of breath  ? ? ?Denies any chest pain or palpitations, abdominal pain, N/V/D, dysuria or leg swelling.  ? ?She does not smoke or drink alcohol.  ? ?She is not vaccinated for covid. ? ?ER Course:  vitals: temp: 103.2, bp: 128/78, HR: 136, RR: 17, oxygen: 92% RA ?Pertinent labs: covid +, creatinine 1.34 (1.2 a year ago), hgb 11.5,  ?CXR: no active disease  ?In ED: started on paxlovid, given 1L IVF.  ? ?Review of Systems: As mentioned in the history of present illness. All other systems reviewed and are negative. ?Past Medical History:  ?Diagnosis Date  ? Cellulitis 05/20/2013  ? RT LEG SPIDER BITE  ? GERD (gastroesophageal reflux disease)   ? Hypertension   ? PATIENT STATES SHE DOES NOT FEEL SHE HAS HTN   ? Kidney disease   ? Pancreatitis   ? ?Past Surgical History:  ?Procedure Laterality Date  ? CESAREAN SECTION    ? ?Social History:  reports that she has quit smoking. Her smoking use included cigarettes. She has a 15.00  pack-year smoking history. She has never used smokeless tobacco. She reports that she does not drink alcohol and does not use drugs. ? ?Allergies  ?Allergen Reactions  ? Levaquin [Levofloxacin In D5w] Hives  ? Penicillins   ?  rash  ? ? ?Family History  ?Problem Relation Age of Onset  ? Cancer Mother 40  ?     ovarian  ? Hypertension Mother   ? Cancer Father   ?     sarcoma  ? Hypertension Sister   ? Hypertension Brother   ? ? ?Prior to Admission medications   ?Medication Sig Start Date End Date Taking? Authorizing Provider  ?albuterol (VENTOLIN HFA) 108 (90 Base) MCG/ACT inhaler Inhale 2 puffs into the lungs every 6 (six) hours as needed. 11/07/21   [provider]  ?diclofenac Sodium (VOLTAREN) 1 % GEL SMARTSIG:Gram(s) Topical Daily PRN 11/07/21   [provider]  ?fluticasone (FLONASE) 50 MCG/ACT nasal spray Place 2 sprays into both nostrils daily. 10/14/21   [provider]  ?losartan (COZAAR) 25 MG tablet Take 25 mg by mouth daily. 08/12/21   [provider]  ?omeprazole (PRILOSEC) 40 MG capsule Take 40 mg by mouth daily.    [provider]  ?rosuvastatin (CRESTOR) 10 MG tablet Take 10 mg by mouth at bedtime. 11/07/21   [provider]  ?hydrochlorothiazide (HYDRODIURIL) 25 MG tablet Take 1 tablet (25 mg total)  by mouth daily. 11/15/13 12/29/19  Schoenhoff, Altamese Cabal, MD  ? ? ?Physical Exam: ?Vitals:  ? 11/29/21 1512 11/29/21 1513 11/29/21 1515 11/29/21 1648  ?BP:   124/73 129/67  ?Pulse: 93 90 89 78  ?Resp:    16  ?Temp:   99.1 ?F (37.3 ?C) 99 ?F (37.2 ?C)  ?TempSrc:   Oral Oral  ?SpO2: 95% 96% 96% 97%  ?Weight:      ?Height:      ? ?General:  Appears calm and comfortable and is in NAD ?Eyes:  PERRL, EOMI, normal lids, iris ?ENT:  grossly normal hearing, lips & tongue, mmm; appropriate dentition ?Neck:  no LAD, masses or thyromegaly; no carotid bruits ?Cardiovascular:  RRR, no m/r/g. No LE edema.  ?Respiratory:   CTA bilaterally with no wheezes/rales/rhonchi.   Normal respiratory effort. ?Abdomen:  soft, NT, ND, NABS ?Back:   normal alignment, no CVAT ?Skin:  no rash or induration seen on limited exam ?Musculoskeletal:  grossly normal tone BUE/BLE, good ROM, no bony abnormality ?Lower extremity:  No LE edema.  Limited foot exam with no ulcerations.  2+ distal pulses. ?Psychiatric:  grossly normal mood and affect, speech fluent and appropriate, AOx3 ?Neurologic:  CN 2-12 grossly intact, moves all extremities in coordinated fashion, sensation intact ? ? ?Radiological Exams on Admission: ?Independently reviewed - see discussion in A/P where applicable ? ?DG Chest Port 1 View ? ?Result Date: 11/28/2021 ?CLINICAL DATA:  Fever, fatigue, headaches EXAM: PORTABLE CHEST 1 VIEW COMPARISON:  06/20/2020 FINDINGS: Cardiac size is within normal limits. Lung fields are clear of any infiltrates or pulmonary edema. There is no pleural effusion or pneumothorax. IMPRESSION: No active disease. Electronically Signed   By: Elmer Picker M.D.   On: 11/28/2021 19:00   ? ?EKG: Independently reviewed.  NSR with rate 127; nonspecific ST changes with no evidence of acute ischemia ? ? ?Labs on Admission: I have personally reviewed the available labs and imaging studies at the time of the admission. ? ?Pertinent labs:   ?covid +,  ?creatinine 1.34 (1.2 a year ago),  ?hgb 11.5,  ? ?Assessment and Plan: ?Principal Problem: ?  COVID-19 virus infection ?Active Problems: ?  CKD (chronic kidney disease) stage 3, GFR 30-59 ml/min (HCC) ?  Essential hypertension, benign ?  ? ?Assessment and Plan: ?* COVID-19 virus infection ?66 year old female presenting with fever, chills, body aches and headache found to have covid 19. Oxygenation on room air was 90-92% and oxygen was placed at 1-2L.  ?-admit for covid 19 with decreased oxygenation, not hypoxic ?-CXR with no active disease ?-on arrival to [facility] she had showered, walked around and oxygen at 97-98% on room air, feeling much better. Discussed with nursing to  see how she does on room air/after walking  ?-will continue renally dosed paxlovid. No steroids at this time as she is maintaining her oxygen >93% on room air ?-PPE/airbone/contact precautions ?-check covid labs x1 ?-continue tylenol for fever ?-not really coughing ?-vitamins/inhaler if needed  ?-supportive care  ? ? ?CKD (chronic kidney disease) stage 3, GFR 30-59 ml/min (HCC) ?Labs from pcp in 12/22 showed creatinine of 1.7, now followed by nephrology ?Creatinine today 1.34, stable ?Continue to monitor  ? ?Essential hypertension, benign ?Well controlled, continue cozaar  ? ? ? ?Advance Care Planning:   Code Status: Full Code  ? ?Consults: none  ? ?DVT Prophylaxis: lovenox  ? ?Family Communication: daughter at bedside: Shon Millet  ? ?Severity of Illness: ?The appropriate patient status for this patient  is INPATIENT. Inpatient status is judged to be reasonable and necessary in order to provide the required intensity of service to ensure the patient's safety. The patient's presenting symptoms, physical exam findings, and initial radiographic and laboratory data in the context of their chronic comorbidities is felt to place them at high risk for further clinical deterioration. Furthermore, it is not anticipated that the patient will be medically stable for discharge from the hospital within 2 midnights of admission.  ? ?* I certify that at the point of admission it is my clinical judgment that the patient will require inpatient hospital care spanning beyond 2 midnights from the point of admission due to high intensity of service, high risk for further deterioration and high frequency of surveillance required.* ? ?Author: ?Orma Flaming, MD ?11/29/2021 6:05 PM ? ?For on call review [email or web address].  ? ?

## 2021-11-29 NOTE — Assessment & Plan Note (Signed)
66 year old female presenting with fever, chills, body aches and headache found to have covid 19. Oxygenation on room air was 90-92% and oxygen was placed at 1-2L.  ?-admit for covid 19 with decreased oxygenation, not hypoxic ?-CXR with no active disease ?-on arrival to [facility] she had showered, walked around and oxygen at 97-98% on room air, feeling much better. Discussed with nursing to see how she does on room air/after walking  ?-will continue renally dosed paxlovid. No steroids at this time as she is maintaining her oxygen >93% on room air ?-PPE/airbone/contact precautions ?-check covid labs x1 ?-continue tylenol for fever ?-not really coughing ?-vitamins/inhaler if needed  ?-supportive care  ? ?

## 2021-11-29 NOTE — Assessment & Plan Note (Signed)
Well controlled, continue cozaar  ?

## 2021-11-29 NOTE — ED Notes (Signed)
Report given to accepting RN Devin. All questions and concerns addressed. RN notified that Paxlovid will be sent with transport and that the pt is requesting to speak to the provider  ?

## 2021-11-29 NOTE — ED Notes (Signed)
Pt report given to [facility]  ?

## 2021-11-29 NOTE — ED Notes (Signed)
[facility] at bedside 

## 2021-11-29 NOTE — ED Notes (Signed)
Pt sats dropped from 97% to 92% over 10 minute while at rest. Pt o2 to 1L N/c ?

## 2021-11-29 NOTE — Assessment & Plan Note (Signed)
Labs from pcp in 12/22 showed creatinine of 1.7, now followed by nephrology ?Creatinine today 1.34, stable ?Continue to monitor  ?

## 2021-11-29 NOTE — ED Notes (Addendum)
Paxlovid delivered. ER MD notified that Pt and family is requesting to speak to the provider for further information on the medication Paxlovid. Medication on HOLD IN MAR ?

## 2021-11-29 NOTE — ED Notes (Signed)
Pt o2 turned off to monitor sats and see if pt can tolerate room air only ?

## 2021-11-29 NOTE — Plan of Care (Signed)
Patient's oxygen saturation will remain above 92 percent over next 24 hours ?

## 2021-11-30 DIAGNOSIS — U071 COVID-19: Secondary | ICD-10-CM | POA: Diagnosis not present

## 2021-11-30 DIAGNOSIS — J9601 Acute respiratory failure with hypoxia: Secondary | ICD-10-CM | POA: Diagnosis not present

## 2021-11-30 LAB — CBC WITH DIFFERENTIAL/PLATELET
Abs Immature Granulocytes: 0.01 10*3/uL (ref 0.00–0.07)
Basophils Absolute: 0 10*3/uL (ref 0.0–0.1)
Basophils Relative: 1 %
Eosinophils Absolute: 0.1 10*3/uL (ref 0.0–0.5)
Eosinophils Relative: 3 %
HCT: 36.3 % (ref 36.0–46.0)
Hemoglobin: 12.3 g/dL (ref 12.0–15.0)
Immature Granulocytes: 0 %
Lymphocytes Relative: 43 %
Lymphs Abs: 1.4 10*3/uL (ref 0.7–4.0)
MCH: 33.1 pg (ref 26.0–34.0)
MCHC: 33.9 g/dL (ref 30.0–36.0)
MCV: 97.6 fL (ref 80.0–100.0)
Monocytes Absolute: 0.3 10*3/uL (ref 0.1–1.0)
Monocytes Relative: 10 %
Neutro Abs: 1.4 10*3/uL — ABNORMAL LOW (ref 1.7–7.7)
Neutrophils Relative %: 43 %
Platelets: 133 10*3/uL — ABNORMAL LOW (ref 150–400)
RBC: 3.72 MIL/uL — ABNORMAL LOW (ref 3.87–5.11)
RDW: 14.8 % (ref 11.5–15.5)
WBC: 3.2 10*3/uL — ABNORMAL LOW (ref 4.0–10.5)
nRBC: 0 % (ref 0.0–0.2)

## 2021-11-30 LAB — COMPREHENSIVE METABOLIC PANEL
ALT: 27 U/L (ref 0–44)
AST: 32 U/L (ref 15–41)
Albumin: 3.4 g/dL — ABNORMAL LOW (ref 3.5–5.0)
Alkaline Phosphatase: 44 U/L (ref 38–126)
Anion gap: 5 (ref 5–15)
BUN: 19 mg/dL (ref 8–23)
CO2: 23 mmol/L (ref 22–32)
Calcium: 8.9 mg/dL (ref 8.9–10.3)
Chloride: 110 mmol/L (ref 98–111)
Creatinine, Ser: 1.32 mg/dL — ABNORMAL HIGH (ref 0.44–1.00)
GFR, Estimated: 45 mL/min — ABNORMAL LOW (ref >60)
Glucose, Bld: 112 mg/dL — ABNORMAL HIGH (ref 70–99)
Potassium: 4.2 mmol/L (ref 3.5–5.1)
Sodium: 138 mmol/L (ref 135–145)
Total Bilirubin: 0.7 mg/dL (ref 0.3–1.2)
Total Protein: 6.5 g/dL (ref 6.5–8.1)

## 2021-11-30 LAB — URINE CULTURE: Culture: <10000 — AB

## 2021-11-30 MED ORDER — GUAIFENESIN-DM 100-10 MG/5ML PO SYRP: 10.0000 mL | ORAL_SOLUTION | ORAL | 0 refills | Status: AC | PRN

## 2021-11-30 MED ORDER — NIRMATRELVIR/RITONAVIR (PAXLOVID) TABLET (RENAL DOSING): 2.0000 | ORAL_TABLET | Freq: Two times a day (BID) | ORAL | 0 refills | Status: AC

## 2021-11-30 MED ORDER — ACETAMINOPHEN 500 MG PO TABS: 500.0000 mg | ORAL_TABLET | Freq: Three times a day (TID) | ORAL | 0 refills | Status: AC | PRN

## 2021-11-30 MED ORDER — LACTATED RINGERS IV SOLN: INTRAVENOUS | Status: DC

## 2021-11-30 NOTE — Progress Notes (Signed)
Patient discharging home. Vital signs stable at time of discharge as reflected in discharge summary. Discharge instructions given and verbal understanding returned. No questions at this time. 

## 2021-11-30 NOTE — Discharge Summary (Signed)
?                                                                                ? Kelly Callahan [id number] DOB: 1955/10/31 DOA: 11/28/2021 ? ?PCP: Rudene Anda, MD ? ?Admit date: 11/28/2021  Discharge date: 11/30/2021 ? ?Admitted From: Home   Disposition:  Home ? ? ?Recommendations for Outpatient Follow-up:  ? ?Follow up with PCP in 1-2 weeks ? ?PCP Please obtain BMP/CBC, 2 view CXR in 1week,  (see Discharge instructions)  ? ?PCP Please follow up on the following pending results: check CBC, BMP and CXR in 1-2 weeks ? ? ?Home Health: None   ?Equipment/Devices: None  ?Consultations: None  ?Discharge Condition: Stable    ?CODE STATUS: Full    ?Diet Recommendation: Heart Healthy  ? ?Diet Order   ? ?       ?  Diet regular Room service appropriate? Yes; Fluid consistency: Thin  Diet effective now       ?  ? ?  ?  ? ?  ?  ? ?Chief Complaint  ?Patient presents with  ? Fever  ?  ? ?Brief history of present illness from the day of admission and additional interim summary   ? ? 66 y.o. female with medical history significant of HTN, GERD, CKD stage 3 who presented to ED with complaints of  fever, headache, cough and chills and urinary frequency. Symptoms began this AM. She also has diffuse body aches. She states her temperature was 105 at home with shortness of breath so they came to ED. ? ?                                                               Hospital Course  ? ? ? ? ?Discharge diagnosis   ? ? ?Principal Problem: ?  COVID-19 virus infection ?Active Problems: ?  CKD (chronic kidney disease) stage 3, GFR 30-59 ml/min (HCC) ?  Essential hypertension, benign ? ? ? ?Discharge instructions   ? ?COVID-19 virus infection ?66 year old female presenting with fever, chills, body aches and headache found to have covid 19.  Chest x-ray, on room air and comfortable this time, stable inflammatory markers all suggesting a very mild COVID-19  infection.  She has been started on Paxlovid and she will finish the course, she will get supportive treatment for cough and fevers at home.  Follow-up with PCP in a week. ?  ?  ?CKD (chronic kidney disease) stage 3, GFR 30-59 ml/min (HCC) ?Labs from pcp in 12/22 showed creatinine of 1.7, now followed by nephrology ?Creatinine today 1.34, stable ?PCP to continue to monitor ?  ?Essential hypertension, benign ?Well controlled, continue cozaar  ? ?Discharge Medications  ? ?Allergies as of 11/30/2021   ? ?   Reactions  ? Levaquin [levofloxacin In D5w] Hives  ? Penicillins   ? rash  ? ?  ? ?  ?Medication List  ?  ? ?TAKE these medications   ? ?  acetaminophen 500 MG tablet ?Commonly known as: TYLENOL ?Take 1 tablet (500 mg total) by mouth every 8 (eight) hours as needed for moderate pain, fever or headache. ?  ?albuterol 108 (90 Base) MCG/ACT inhaler ?Commonly known as: VENTOLIN HFA ?Inhale 2 puffs into the lungs every 6 (six) hours as needed. ?  ?diclofenac Sodium 1 % Gel ?Commonly known as: VOLTAREN ?SMARTSIG:Gram(s) Topical Daily PRN ?  ?fluticasone 50 MCG/ACT nasal spray ?Commonly known as: FLONASE ?Place 2 sprays into both nostrils daily. ?  ?guaiFENesin-dextromethorphan 100-10 MG/5ML syrup ?Commonly known as: ROBITUSSIN DM ?Take 10 mLs by mouth every 4 (four) hours as needed for cough. ?  ?losartan 25 MG tablet ?Commonly known as: COZAAR ?Take 25 mg by mouth daily. ?  ?nirmatrelvir/ritonavir EUA (renal dosing) 10 x 150 MG & 10 x 100MG  Tabs ?Commonly known as: PAXLOVID ?Take 2 tablets by mouth 2 (two) times daily for 5 days. ?  ?omeprazole 40 MG capsule ?Commonly known as: PRILOSEC ?Take 40 mg by mouth daily. ?  ?rosuvastatin 10 MG tablet ?Commonly known as: CRESTOR ?Take 10 mg by mouth at bedtime. ?  ? ?  ? ? ? Follow-up Information   ? ? Rudene Anda, MD. Schedule an appointment as soon as possible for a visit in 1 week(s).   ?Specialty: Internal Medicine ?Contact information: ?[address] ?[address] ?[place]  [place] [address] ?[phone] ? ? ?  ?  ? ?  ?  ? ?  ? ? ?Major procedures and Radiology Reports - PLEASE review detailed and final reports thoroughly  -    ? ? ?DG Chest Port 1 View ? ?Result Date: 11/28/2021 ?CLINICAL DATA:  Fever, fatigue, headaches EXAM: PORTABLE CHEST 1 VIEW COMPARISON:  06/20/2020 FINDINGS: Cardiac size is within normal limits. Lung fields are clear of any infiltrates or pulmonary edema. There is no pleural effusion or pneumothorax. IMPRESSION: No active disease. Electronically Signed   By: Elmer Picker M.D.   On: 11/28/2021 19:00   ? ?Today  ? ?Subjective  ? ? ?Kelly Callahan today has no headache,no chest abdominal pain,no new weakness tingling or numbness, feels much better wants to go home today.   ?  ?Objective  ? ?Blood pressure 126/73, pulse 86, temperature 99 ?F (37.2 ?C), temperature source Oral, resp. rate 19, height 5' 5.75" (1.67 m), weight 84.4 kg, SpO2 99 %. ? ?No intake or output data in the 24 hours ending 11/30/21 0930 ? ?Exam ? ?Awake Alert, No new F.N deficits,    ?[place].AT,PERRAL ?Supple Neck,   ?Symmetrical Chest wall movement, Good air movement bilaterally, CTAB ?RRR,No Gallops,   ?+ve B.Sounds, Abd Soft, Non tender,  ?No Cyanosis, Clubbing or edema  ? ? Data Review  ? ?CBC w Diff:  ?Lab Results  ?Component Value Date  ? WBC 3.2 (L) 11/30/2021  ? HGB 12.3 11/30/2021  ? HCT 36.3 11/30/2021  ? PLT 133 (L) 11/30/2021  ? LYMPHOPCT 43 11/30/2021  ? MONOPCT 10 11/30/2021  ? EOSPCT 3 11/30/2021  ? BASOPCT 1 11/30/2021  ? ? ?CMP:  ?Lab Results  ?Component Value Date  ? NA 138 11/30/2021  ? K 4.2 11/30/2021  ? CL 110 11/30/2021  ? CO2 23 11/30/2021  ? BUN 19 11/30/2021  ? CREATININE 1.32 (H) 11/30/2021  ? CREATININE 1.04 01/02/2014  ? PROT 6.5 11/30/2021  ? ALBUMIN 3.4 (L) 11/30/2021  ? BILITOT 0.7 11/30/2021  ? ALKPHOS 44 11/30/2021  ? AST 32 11/30/2021  ? ALT 27 11/30/2021  ?. ? ? ?  Total Time in preparing paper work, data evaluation and todays exam - 35 minutes ? ?Lala Lund M.D  on 11/30/2021 at 9:30 AM ? ?[facility]   ?  ? ?

## 2021-11-30 NOTE — Discharge Instructions (Signed)
Follow with Primary MD Truett PernaWang, Yun, MD in 7 days  ? ?Get CBC, CMP, 2 view Chest X ray -  checked next visit within 1 week by Primary MD  ? ?Activity: As tolerated with Full fall precautions use walker/cane & assistance as needed ? ?Disposition Home  ? ?Diet: Heart Healthy   ? ?Special Instructions: If you have smoked or chewed Tobacco  in the last 2 yrs please stop smoking, stop any regular Alcohol  and or any Recreational drug use. ? ?On your next visit with your primary care physician please Get Medicines reviewed and adjusted. ? ?Please request your Prim.MD to go over all Hospital Tests and Procedure/Radiological results at the follow up, please get all Hospital records sent to your Prim MD by signing hospital release before you go home. ? ?If you experience worsening of your admission symptoms, develop shortness of breath, life threatening emergency, suicidal or homicidal thoughts you must seek medical attention immediately by calling 911 or calling your MD immediately  if symptoms less severe. ? ?You Must read complete instructions/literature along with all the possible adverse reactions/side effects for all the Medicines you take and that have been prescribed to you. Take any new Medicines after you have completely understood and accpet all the possible adverse reactions/side effects.  ? ? ? ? ? ?Person Under Monitoring Name: Kelly Callahan ? ?Location: [address] ?[place] [id number] ? ? ?Infection Prevention Recommendations for Individuals Confirmed to have, ?or Being Evaluated for, 2019 Novel Coronavirus (COVID-19) Infection Who ?Receive Care at Home ? ?Individuals who are confirmed to have, or are being evaluated for, COVID-19 should follow the prevention steps below ?until a healthcare provider or local or state health department says they can return to normal activities. ? ?Stay home except to get medical care ?You should restrict activities outside your home, except for getting medical care. Do  not go to work, school, or public ?areas, and do not use public transportation or taxis. ? ?Call ahead before visiting your doctor ?Before your medical appointment, call the healthcare provider and tell them that you have, or are being evaluated for, ?COVID-19 infection. This will help the healthcare provider?s office take steps to keep other people from getting infected. ?Ask your healthcare provider to call the local or state health department. ? ?Monitor your symptoms ?Seek prompt medical attention if your illness is worsening (e.g., difficulty breathing). Before going to your medical ?appointment, call the healthcare provider and tell them that you have, or are being evaluated for, COVID-19 infection. Ask ?your healthcare provider to call the local or state health department. ? ?Wear a facemask ?You should wear a facemask that covers your nose and mouth when you are in the same room with other people and ?when you visit a healthcare provider. People who live with or visit you should also wear a facemask while they are in the ?same room with you. ? ?Separate yourself from other people in your home ?As much as possible, you should stay in a different room from other people in your home. Also, you should use a separate ?bathroom, if available. ? ?Avoid sharing household items ?You should not share dishes, drinking glasses, cups, eating utensils, towels, bedding, or other items with other people in ?your home. After using these items, you should wash them thoroughly with soap and water. ? ?Cover your coughs and sneezes ?Cover your mouth and nose with a tissue when you cough or sneeze, or you can cough or sneeze into  your sleeve. Throw ?used tissues in a lined trash can, and immediately wash your hands with soap and water for at least 20 seconds or use an ?alcohol-based hand rub. ? ?Wash your hands ?Wash your hands often and thoroughly with soap and water for at least 20 seconds. You can use an alcohol-based  hand ?sanitizer if soap and water are not available and if your hands are not visibly dirty. Avoid touching your eyes, nose, and ?mouth with unwashed hands. ? ? ?Prevention Steps for Caregivers and Household Members of ?Individuals Confirmed to have, or Being Evaluated for, COVID-19 Infection Being Cared for in the Home ? ?If you live with, or provide care at home for, a person confirmed to have, or being evaluated for, COVID-19 infection ?please follow these guidelines to prevent infection: ? ?Follow healthcare provider?s instructions ?Make sure that you understand and can help the patient follow any healthcare provider instructions for all care. ? ?Provide for the patient?s basic needs ?You should help the patient with basic needs in the home and provide support for getting groceries, prescriptions, and ?other personal needs. ? ?Monitor the patient?s symptoms ?If they are getting sicker, call his or her medical provider and tell them that the patient has, or is being evaluated for, ?COVID-19 infection. This will help the healthcare provider?s office take steps to keep other people from getting infected. ?Ask the healthcare provider to call the local or state health department. ? ?Limit the number of people who have contact with the patient ?If possible, have only one caregiver for the patient. ?Other household members should stay in another home or place of residence. If this is not possible, they should stay ?in another room, or be separated from the patient as much as possible. Use a separate bathroom, if available. ?Restrict visitors who do not have an essential need to be in the home. ? ?Keep older adults, very young children, and other sick people away from the patient ?Keep older adults, very young children, and those who have compromised immune systems or chronic health conditions away from the patient. This includes people with chronic heart, lung, or kidney conditions, diabetes, and cancer. ? ?Ensure good  ventilation ?Make sure that shared spaces in the home have good air flow, such as from an air conditioner or an opened window, ?weather permitting. ? ?Wash your hands often ?Wash your hands often and thoroughly with soap and water for at least 20 seconds. You can use an alcohol based hand sanitizer if soap and water are not available and if your hands are not visibly dirty. ?Avoid touching your eyes, nose, and mouth with unwashed hands. ?Use disposable paper towels to dry your hands. If not available, use dedicated cloth towels and replace them when they become wet. ? ?Wear a facemask and gloves ?Wear a disposable facemask at all times in the room and gloves when you touch or have contact with the patient?s blood, body fluids, and/or secretions or excretions, such as sweat, saliva, sputum, nasal mucus, vomit, urine, or feces.  Ensure the mask fits over your nose and mouth tightly, and do not touch it during use. ?Throw out disposable facemasks and gloves after using them. Do not reuse. ?Wash your hands immediately after removing your facemask and gloves. ?If your personal clothing becomes contaminated, carefully remove clothing and launder. Wash your hands after handling contaminated clothing. ?Place all used disposable facemasks, gloves, and other waste in a lined container before disposing them with other household waste. ?Remove gloves and  wash your hands immediately after handling these items. ? ?Do not share dishes, glasses, or other household items with the patient ?Avoid sharing household items. You should not share dishes, drinking glasses, cups, eating utensils, towels, bedding, or other items with a patient who is confirmed to have, or being evaluated for, COVID-19 infection. ?After the person uses these items, you should wash them thoroughly with soap and water. ? ?[employer] thoroughly ?Immediately remove and wash clothes or bedding that have blood, body fluids, and/or secretions or excretions, such as  sweat, saliva, sputum, nasal mucus, vomit, urine, or feces, on them. ?Wear gloves when handling laundry from the patient. ?Read and follow directions on labels of laundry or clothing items and detergent. In g

## 2021-11-30 NOTE — Progress Notes (Signed)
Walked patient around room for 2 minutes per MD order. Oxygen saturation 95-98% while ambulating on room air. ?

## 2021-12-03 LAB — CULTURE, BLOOD (ROUTINE X 2)
Culture: NO GROWTH
Culture: NO GROWTH
Special Requests: ADEQUATE
Special Requests: ADEQUATE

## 2022-05-17 IMAGING — DX DG CHEST 1V PORT
1 series · 1 of 1 positions shown · non-contrast
Comparison: 06/20/2020

CLINICAL DATA: Fever, fatigue, headaches

EXAM:
PORTABLE CHEST 1 VIEW

[chest ap]
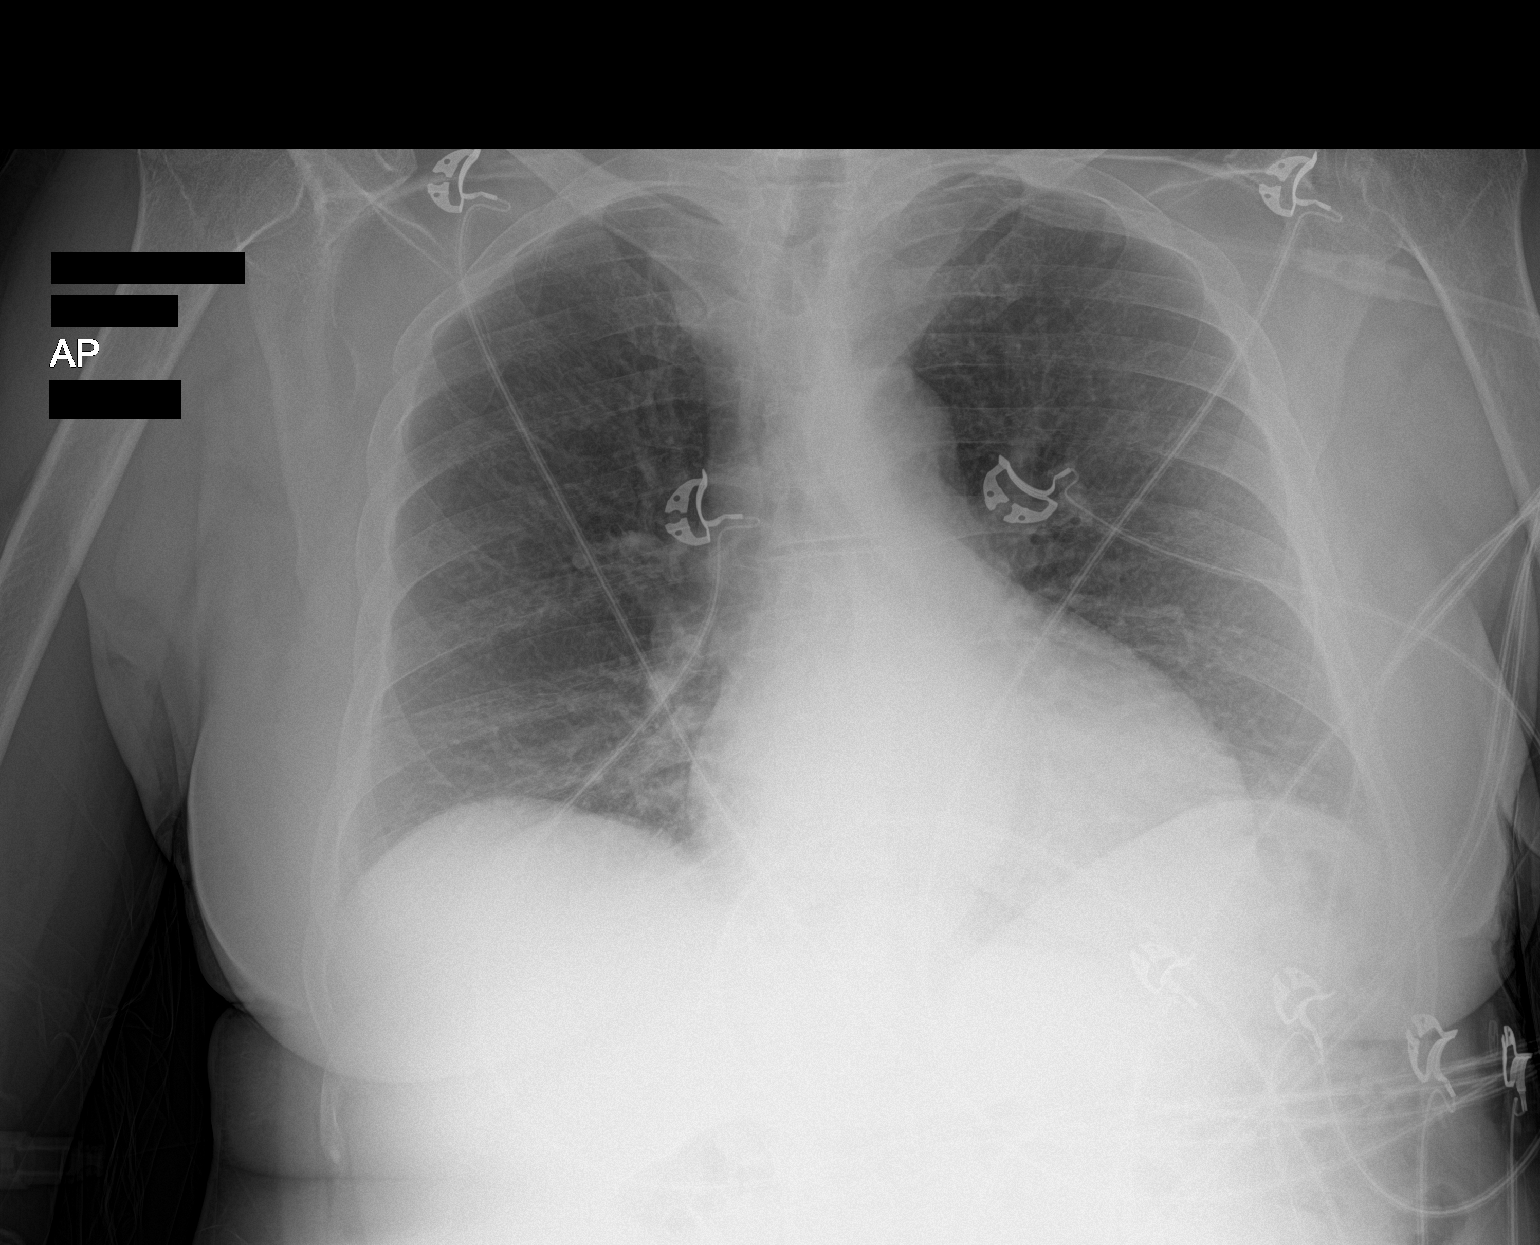

[1 of 1 positions shown; findings below may reference images not displayed]

FINDINGS: Cardiac size is within normal limits. Lung fields are clear of any
infiltrates or pulmonary edema. There is no pleural effusion or
pneumothorax.
IMPRESSION: No active disease.

## 2023-09-30 ENCOUNTER — Emergency Department (HOSPITAL_BASED_OUTPATIENT_CLINIC_OR_DEPARTMENT_OTHER)
Admission: EM | Admit: 2023-09-30 | Discharge: 2023-09-30 | Discharge status: Against Medical Advice | Attending: Emergency Medicine | Admitting: Emergency Medicine

## 2023-09-30 ENCOUNTER — Other Ambulatory Visit: Payer: Self-pay

## 2023-09-30 ENCOUNTER — Encounter (HOSPITAL_BASED_OUTPATIENT_CLINIC_OR_DEPARTMENT_OTHER): Payer: Self-pay | Admitting: Emergency Medicine

## 2023-09-30 DIAGNOSIS — M7989 Other specified soft tissue disorders: Secondary | ICD-10-CM | POA: Diagnosis present

## 2023-09-30 DIAGNOSIS — Z5321 Procedure and treatment not carried out due to patient leaving prior to being seen by health care provider: Secondary | ICD-10-CM | POA: Insufficient documentation

## 2023-09-30 DIAGNOSIS — R0600 Dyspnea, unspecified: Secondary | ICD-10-CM | POA: Insufficient documentation

## 2023-09-30 DIAGNOSIS — M79673 Pain in unspecified foot: Secondary | ICD-10-CM

## 2023-09-30 LAB — COMPREHENSIVE METABOLIC PANEL
ALT: 14 U/L (ref 0–44)
AST: 19 U/L (ref 15–41)
Albumin: 3.8 g/dL (ref 3.5–5.0)
Alkaline Phosphatase: 27 U/L — ABNORMAL LOW (ref 38–126)
Anion gap: 6 (ref 5–15)
BUN: 24 mg/dL — ABNORMAL HIGH (ref 8–23)
CO2: 23 mmol/L (ref 22–32)
Calcium: 8.8 mg/dL — ABNORMAL LOW (ref 8.9–10.3)
Chloride: 109 mmol/L (ref 98–111)
Creatinine, Ser: 1.25 mg/dL — ABNORMAL HIGH (ref 0.44–1.00)
GFR, Estimated: 47 mL/min — ABNORMAL LOW (ref >60)
Glucose, Bld: 96 mg/dL (ref 70–99)
Potassium: 4.2 mmol/L (ref 3.5–5.1)
Sodium: 138 mmol/L (ref 135–145)
Total Bilirubin: 0.6 mg/dL (ref 0.0–1.2)
Total Protein: 7.3 g/dL (ref 6.5–8.1)

## 2023-09-30 LAB — CBC WITH DIFFERENTIAL/PLATELET
Abs Immature Granulocytes: 0.01 10*3/uL (ref 0.00–0.07)
Basophils Absolute: 0 10*3/uL (ref 0.0–0.1)
Basophils Relative: 1 %
Eosinophils Absolute: 0.2 10*3/uL (ref 0.0–0.5)
Eosinophils Relative: 4 %
HCT: 33.9 % — ABNORMAL LOW (ref 36.0–46.0)
Hemoglobin: 11.2 g/dL — ABNORMAL LOW (ref 12.0–15.0)
Immature Granulocytes: 0 %
Lymphocytes Relative: 37 %
Lymphs Abs: 1.5 10*3/uL (ref 0.7–4.0)
MCH: 32.6 pg (ref 26.0–34.0)
MCHC: 33 g/dL (ref 30.0–36.0)
MCV: 98.5 fL (ref 80.0–100.0)
Monocytes Absolute: 0.5 10*3/uL (ref 0.1–1.0)
Monocytes Relative: 13 %
Neutro Abs: 1.9 10*3/uL (ref 1.7–7.7)
Neutrophils Relative %: 45 %
Platelets: 227 10*3/uL (ref 150–400)
RBC: 3.44 MIL/uL — ABNORMAL LOW (ref 3.87–5.11)
RDW: 14.8 % (ref 11.5–15.5)
WBC: 4.1 10*3/uL (ref 4.0–10.5)
nRBC: 0 % (ref 0.0–0.2)

## 2023-09-30 LAB — BRAIN NATRIURETIC PEPTIDE: B Natriuretic Peptide: 41.4 pg/mL (ref 0.0–100.0)

## 2023-09-30 NOTE — ED Triage Notes (Signed)
 Pt POV c/o R leg swelling, foot swelling and burning x 1 month.  Seen at PCP and UC for same, prescribed "fluid pills and compression socks" nothing helping. Dyspnea on exertion.
